# Patient Record
Sex: Female | Born: 1990 | Race: White | Hispanic: No | Marital: Single | Smoking: Former smoker
Health system: Southern US, Community
[De-identification: ages and names within clinical notes are randomized; demographics above are authoritative.]

## PROBLEM LIST (undated history)

## (undated) DIAGNOSIS — R569 Unspecified convulsions: Secondary | ICD-10-CM

## (undated) DIAGNOSIS — G8929 Other chronic pain: Secondary | ICD-10-CM

## (undated) DIAGNOSIS — J45909 Unspecified asthma, uncomplicated: Secondary | ICD-10-CM

## (undated) DIAGNOSIS — F329 Major depressive disorder, single episode, unspecified: Secondary | ICD-10-CM

## (undated) DIAGNOSIS — R51 Headache: Secondary | ICD-10-CM

## (undated) DIAGNOSIS — F32A Depression, unspecified: Secondary | ICD-10-CM

## (undated) DIAGNOSIS — K219 Gastro-esophageal reflux disease without esophagitis: Secondary | ICD-10-CM

## (undated) DIAGNOSIS — R519 Headache, unspecified: Secondary | ICD-10-CM

## (undated) DIAGNOSIS — T7840XA Allergy, unspecified, initial encounter: Secondary | ICD-10-CM

## (undated) DIAGNOSIS — F419 Anxiety disorder, unspecified: Secondary | ICD-10-CM

## (undated) HISTORY — DX: Anxiety disorder, unspecified: F41.9

## (undated) HISTORY — DX: Headache: R51

## (undated) HISTORY — DX: Other chronic pain: G89.29

## (undated) HISTORY — DX: Depression, unspecified: F32.A

## (undated) HISTORY — DX: Major depressive disorder, single episode, unspecified: F32.9

## (undated) HISTORY — DX: Allergy, unspecified, initial encounter: T78.40XA

## (undated) HISTORY — DX: Headache, unspecified: R51.9

---
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
## 2009-02-05 ENCOUNTER — Ambulatory Visit: Payer: Self-pay | Admitting: Family Medicine

## 2009-04-20 ENCOUNTER — Emergency Department (HOSPITAL_COMMUNITY): Admission: EM | Admit: 2009-04-20 | Discharge: 2009-04-20 | Payer: Self-pay | Admitting: Emergency Medicine

## 2009-07-11 ENCOUNTER — Ambulatory Visit: Payer: Self-pay | Admitting: Family Medicine

## 2009-07-11 LAB — CONVERTED CEMR LAB: Rapid Strep: NEGATIVE

## 2009-12-31 ENCOUNTER — Emergency Department (HOSPITAL_COMMUNITY): Admission: EM | Admit: 2009-12-31 | Discharge: 2009-12-31 | Payer: Self-pay | Admitting: Emergency Medicine

## 2010-07-07 ENCOUNTER — Encounter: Admission: RE | Admit: 2010-07-07 | Discharge: 2010-08-16 | Payer: Self-pay | Admitting: Orthopedic Surgery

## 2010-11-28 ENCOUNTER — Emergency Department (HOSPITAL_COMMUNITY)
Admission: EM | Admit: 2010-11-28 | Discharge: 2010-11-28 | Disposition: A | Discharge status: Home / Self Care | Payer: Medicaid Other | Attending: Emergency Medicine | Admitting: Emergency Medicine

## 2010-11-28 DIAGNOSIS — X58XXXA Exposure to other specified factors, initial encounter: Secondary | ICD-10-CM | POA: Insufficient documentation

## 2010-11-28 DIAGNOSIS — G40909 Epilepsy, unspecified, not intractable, without status epilepticus: Secondary | ICD-10-CM | POA: Insufficient documentation

## 2010-11-28 DIAGNOSIS — M549 Dorsalgia, unspecified: Secondary | ICD-10-CM | POA: Insufficient documentation

## 2011-01-10 ENCOUNTER — Ambulatory Visit: Payer: Worker's Compensation | Admitting: Family Medicine

## 2011-02-08 NOTE — Assessment & Plan Note (Signed)
[facility]                                 ON-CALL NOTE   NAME:FRITZGenessa, Beman                         MRN:          [id number]  DATE:11/09/2007                            DOB:          Jun 17, 1991    Phone number is [phone].  It is Clydie Braun at the [employer].  The patient of Dr. Claris Che, question about dosage of medication.  Dosage  was rechecked and is okay.     Jeffrey A. Tawanna Cooler, MD  Electronically Signed    JAT/MedQ  DD: 11/09/2007  DT: 11/12/2007  Job #: [id number]

## 2011-02-11 NOTE — Op Note (Signed)
   NAME:  Brenda Vargas, Brenda Vargas                          ACCOUNT NO.:  [id number]   MEDICAL RECORD NO.:  [id number]                   PATIENT TYPE:  AMB   LOCATION:  DAY                                  FACILITY:  [facility]   PHYSICIAN:  Marlowe Kays, M.D.               DATE OF BIRTH:  1991-06-26   DATE OF PROCEDURE:  DATE OF DISCHARGE:                                 OPERATIVE REPORT   PREOPERATIVE DIAGNOSIS:  Stiffness, left knee, status post open  reduction/internal fixation, displaced tibial tuberosity fracture.   POSTOPERATIVE DIAGNOSIS:  Stiffness, left knee, status post open  reduction/internal fixation, displaced tibial tuberosity fracture.   OPERATION/PROCEDURE:  Closed manipulation, left knee.   SURGEON:  Marlowe Kays, M.D.   ASSISTANT:  Nurse.   ANESTHESIA:  General.   PATHOLOGY AND INDICATIONS:  About 10 weeks ago I had performed open  reduction/internal fixation for displaced tibial tuberosity fracture with  attached ACL.  She was held for six weeks in a cast but on physical therapy  has not wanted to move the knee and therapist had felt that apprehension was  the major cause.  When I saw her in the office yesterday, she would only  move the knee from 0 to 55 degrees and I felt that closed manipulation was  indicated.   DESCRIPTION OF PROCEDURE:  Satisfactory general anesthesia.  Once I took her  left leg and placed my arm beneath her thigh and the knee easily went to  about 75 degrees without doing anything so that we obtained an extra 20  degrees just by anesthetizing.  I then was able to gently manipulate the  knee, bring the knee in flexion and extension and back to about 100 to 105  degrees.  I did not want to force anything and damage the previous repair.  We left that her motion was about from 0 to about 105 degrees of flexion.  At the time of this dictation, she was on her way to the recovery room in  satisfactory condition with no complications.                                        Marlowe Kays, M.D.    JA/MEDQ  D:  06/04/2003  T:  06/04/2003  Job:  [id number]
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
## 2011-02-11 NOTE — Consult Note (Signed)
[place]  Patient:    Brenda Vargas, Brenda Vargas                       MRN: [id number] Proc. Date: 03/02/00 Adm. Date:  [id number] Disc. Date: [id number] Attending:  Benny Lennert CC:         Luz Brazen, M.D., [facility]             Candace Gallus. Ferd Glassing, M.D.                          Consultation Report  DATE OF BIRTH:  1991-01-29  CHIEF COMPLAINT:  Recurrent seizures.  HISTORY OF PRESENT ILLNESS:  Brenda Vargas is a [age]-year-old right-handed girl last seen in my office in 1999.  I believe that the patient has primary generalized epilepsy, juvenile absence type, and had absence and generalized tonic-clonic seizures.  Patient was able to come off of Depakote in 1999 and has had no further seizures until one month ago.  The episode occurred during the day.  Chosen came into the room and had an unusual look upon her face.  She then began to stare unresponsively.  Her father was able to walk her over to the couch and have her lay down.  She remained unresponsive for a period of about seven minutes and then gradually improved.  By the time EMS had arrived at 15 minutes, she was nearly at her baseline.  The decision was made not to transport her to the hospital and I was not called.  Patient was at school today and attended an awards assembly.  She then had a pizza party at her room.  The children were getting ready to go outside and play.  Paislea spilled a drink on herself and then went into a period of unresponsive staring that may have lasted for as long as 20 minutes.  Mashelle remembers that she was not allowed to go outside.  She then does not remember much until she was brought to the hospital.  She arrived at the hospital at around 3:15.  Just prior to that, while she was on the corner of [employer], she had a generalized tonic-clonic seizure that lasted for a few minutes, and was brought to the emergency room to be evaluated.  She was  seen by Dr. Beverely Pace, who asked me to see the patient.  PAST MEDICAL HISTORY:  The patient was born at [facility] and was in the neonatal intensive care unit for two weeks because of elevated white blood cell count and labile sugars.  I do not know if she had sepsis, pneumonia, or what.  The patient has learning disabilities and attention deficit disorder, and is followed by Dr. Virgel Paling.  Patient had onset of her seizures in early elementary school.  As stated above, we were able to taper and discontinue her medication because she had been seizure-free for two years, and has been seizure-free for yet another two years since that time.  Of note is that the patient has had headaches and some malaise after her seizures.  Also, today she complained of pain in her right foot and there is a bruise along the left metatarsal that was x-rayed and shows a growth plate.  Differential diagnosis is a fracture, but Dr. Beverely Pace has looked at it and feels that it is not a fracture.  Fortunately, the patient has not lost control of her  bowels and bladder.  She did not stop breathing.  She had confusion for at least five to ten minutes after the grand mal seizure.  The patient had perioral cyanosis during her generalized tonic-clonic seizure.  REVIEW OF SYSTEMS:  Is unremarkable for intercurrent infections in head and neck, lungs, GI, GU, rashes and easy bruisability, diabetes, or thyroid disease.  Review of systems is otherwise negative.  CURRENT MEDICATIONS:  None.  ALLERGIES:  None.  SOCIAL HISTORY:  The patient has just completed fourth grade and will attend fifth grade next year.  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  FAMILY HISTORY:  Positive for diabetes mellitus and seizures.  PHYSICAL EXAMINATION:  GENERAL:  This is a well-developed, well-nourished, right-handed, Caucasian, tan, brown-haired, brown-eyed girl in no distress.  VITAL SIGNS:  Blood  pressure 122/80, resting pulse 87, respirations 22, temperature 97, pulse oximetry 97%.  HEENT:  No signs of infection.  Tongue is not bitten nor are there any lesions in her mouth.  LUNGS:  Clear to auscultation.  HEART:  No murmurs, pulses normal.  ABDOMEN:  Soft, nontender, bowel sounds normal.  EXTREMITIES:  Well-formed without edema, cyanosis, alterations in tone, or tight heel cords.  She does have a bruise on the outer aspect of her right foot at the proximal metatarsal.  NEUROLOGIC:  Awake, alert.  She did have some problems following two and three-step commands, but by and large, was able to name objects, follow commands, and repeat phrases.  Cranial nerve examination:  Round, reactive pupils, normal fundi.  Visual fields are full to double simultaneous stimuli.  Extraocular movements full and conjugate.  OKN responses equal bilaterally.  Visual acuity not tested. Symmetric facial strength and sensation.  Air conduction greater than bone conduction bilaterally.  Motor examination:  Normal strength, tone, and mass.  Good fine motor movements.  No pronator drift.  Sensation intact to cold, vibration, stereoagnosis.  Cerebellar examination:  Good finger-to-nose, rapid repetitive movements.  No tremor or dystaxia.  ______, gait, and station normal.  Deep tendon reflexes are symmetrically diminished but present everywhere.  She had bilateral flexor plantar responses.  IMPRESSION: 1. Recurrent seizures, both prolonged nonconvulsive and convulsive.  I believe    that these are probably absence with generalized tonic-clonic seizures,    but I  cannot rule out the possibility of complex partial with secondary    generalization, 345.00, 345.10, 345.40. 2. 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 3. I cannot tell whether she is just showing inattentiveness and impulsitivity    with her problems following commands or whether she still is mildly     postictal.  PLAN:  Will send her on home.  Will give her Depakote 250 mg now and again  before she goes to bed.  She will take Depakote 250 mg b.i.d. and we will check drug levels in about 10 days time.  At the same time, we will recheck an EEG, either at my office or at [facility].  Parents will call tomorrow to set up an appointment.  LABORATORY CURRENTLY:  Hemoglobin 12.8, hematocrit 36.2, MCV 87.1.  Platelet 235,000.  White count 3400, 66 polys, absolute granulocyte count 2300, lymphocytes 26, monocytes 4, eosinophils 1, basophils 1, UC 2.  SGPT 17.  DISPOSITION:  Patient is discharged in improved condition.  I have discussed this thoroughly with her parents. DD:  03/02/00 TD:  03/03/00 Job: [id number]
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
## 2011-02-21 ENCOUNTER — Emergency Department (HOSPITAL_COMMUNITY)
Admission: EM | Admit: 2011-02-21 | Discharge: 2011-02-21 | Disposition: A | Discharge status: Home / Self Care | Payer: Medicaid Other | Attending: Emergency Medicine | Admitting: Emergency Medicine

## 2011-02-21 ENCOUNTER — Emergency Department (HOSPITAL_COMMUNITY): Payer: Medicaid Other

## 2011-02-21 DIAGNOSIS — Z79899 Other long term (current) drug therapy: Secondary | ICD-10-CM | POA: Insufficient documentation

## 2011-02-21 DIAGNOSIS — Y92009 Unspecified place in unspecified non-institutional (private) residence as the place of occurrence of the external cause: Secondary | ICD-10-CM | POA: Insufficient documentation

## 2011-02-21 DIAGNOSIS — G40909 Epilepsy, unspecified, not intractable, without status epilepticus: Secondary | ICD-10-CM | POA: Insufficient documentation

## 2011-02-21 DIAGNOSIS — X500XXA Overexertion from strenuous movement or load, initial encounter: Secondary | ICD-10-CM | POA: Insufficient documentation

## 2011-02-21 DIAGNOSIS — S43006A Unspecified dislocation of unspecified shoulder joint, initial encounter: Secondary | ICD-10-CM | POA: Insufficient documentation

## 2011-03-02 ENCOUNTER — Ambulatory Visit
Admission: RE | Admit: 2011-03-02 | Discharge: 2011-03-02 | Disposition: A | Discharge status: Home / Self Care | Payer: Medicaid Other | Source: Ambulatory Visit | Attending: Orthopedic Surgery | Admitting: Orthopedic Surgery

## 2011-03-02 ENCOUNTER — Other Ambulatory Visit: Payer: Self-pay | Admitting: Orthopedic Surgery

## 2011-03-02 DIAGNOSIS — S42293A Other displaced fracture of upper end of unspecified humerus, initial encounter for closed fracture: Secondary | ICD-10-CM

## 2011-03-08 ENCOUNTER — Ambulatory Visit (HOSPITAL_BASED_OUTPATIENT_CLINIC_OR_DEPARTMENT_OTHER)
Admission: RE | Admit: 2011-03-08 | Discharge: 2011-03-08 | Disposition: A | Discharge status: Home / Self Care | Payer: Medicaid Other | Source: Ambulatory Visit | Attending: Orthopedic Surgery | Admitting: Orthopedic Surgery

## 2011-03-08 DIAGNOSIS — M24419 Recurrent dislocation, unspecified shoulder: Secondary | ICD-10-CM | POA: Insufficient documentation

## 2011-03-08 DIAGNOSIS — M75110 Incomplete rotator cuff tear or rupture of unspecified shoulder, not specified as traumatic: Secondary | ICD-10-CM | POA: Insufficient documentation

## 2011-03-08 DIAGNOSIS — E669 Obesity, unspecified: Secondary | ICD-10-CM | POA: Insufficient documentation

## 2011-03-08 DIAGNOSIS — Z01812 Encounter for preprocedural laboratory examination: Secondary | ICD-10-CM | POA: Insufficient documentation

## 2011-03-08 LAB — POCT HEMOGLOBIN-HEMACUE: Hemoglobin: 14.2 g/dL (ref 12.0–15.0)

## 2011-04-19 NOTE — Op Note (Signed)
NAMEGESENIA, BANTZ NO.:  [id number]  MEDICAL RECORD NO.:  [id number]  LOCATION:                                 FACILITY:  PHYSICIAN:  Jones Broom, MD    DATE OF BIRTH:  [birth year]  DATE OF PROCEDURE:  03/08/2011 DATE OF DISCHARGE:                              OPERATIVE REPORT   PREOPERATIVE DIAGNOSIS:  Right shoulder recurrent instability.  POSTOPERATIVE DIAGNOSES: 1. Right shoulder Bankart anterior labral tear. 2. Right shoulder partial-thickness supraspinatus rotator cuff tear.  PROCEDURE PERFORMED: 1. Right shoulder arthroscopic Bankart repair. 2. Right shoulder arthroscopic rotator cuff repair.  ATTENDING SURGEON:  Jones Broom, MD  ASSISTANT:  Eulas Post, MD  COMPLICATIONS:  None.  DRAINS:  None.  SPECIMENS:  None.  ESTIMATED BLOOD LOSS:  Minimal.  INDICATIONS FOR SURGERY:  The patient is a 20 year old female who has had multiple prior right shoulder dislocations.  The most recent dislocations have been with minimal trauma.  A CT scan demonstrated a large Hill-Sachs defect, which was more superior than posterior and involved the footprint of the supraspinatus insertion.  She was suspected to have a Bankart tear as well as a partial thickness supraspinatus tear.  She was indicated for arthroscopic Bankart repair and possible rotator cuff repair as well.  She understood risks, benefits and alternatives to procedure including, but not limited to risk of bleeding, infection, damage to neurovascular structures, risk of stiffness, nonhealing and potential need for future surgery.  OPERATIVE FINDINGS:  Examination under anesthesia demonstrated grade 3 anterior instability.  No significant posterior or inferior instability. Diagnostic arthroscopy revealed a large Hill-Sachs lesion which extended around superiorly into the footprint of the supraspinatus and posteriorly down the posterior humeral head.  At 90 degrees of  abduction and external rotation, it did not engage, but was felt to be so large in size involving the footprint of the rotator cuff that I did feel a partial articular-sided rotator cuff repair/ remplissage was indicated. She did have a complete Bankart tear which was retracted medially.  This was mobilized and repaired with three 2.4-mm BioComposite suture tacks. The rotator cuff repair was carried out with one 5.5-mm BioComposite Corkscrew anchor into the defect.  PROCEDURE:  The patient was identified in preoperative holding area where I personally marked the operative site after verifying the site, side, and procedure with the patient.  She was taken back to the operating room after interscalene block was given by the attending anesthesiologist.  In general, anesthesia was then induced without complication.  She did have appropriate time-out procedure.  She was placed in a beach-chair position.  The right upper extremity was then prepped and draped.  The opposite extremities were carefully padded and positioned.  The patient did receive IV antibiotics prior to incision. The standard posterior portal was established and the arthroscope was introduced in the joint.  Anterior portal was then established with needle localization above the subscapularis.  Diagnostic arthroscopy was then carried out with findings as described above.  There was no glenoid bone loss.  She did have complete absence of the anterior labrum which was torn and medialized.  No loose bodies were noted in  the joint.  The articular cartilage looks good with the exception of the superior aspect of the humeral head, which had a large impaction injury which extended around posteriorly.  This involved a large portion of the supraspinatus footprint.  A lateral portal was established in the rotator interval just behind the biceps tendon and the camera was moved to this position. A small cannula was placed posteriorly with  a large cannula anteriorly. An elevator was used to completely free up the anterior labrum and a grasper was used to verify that was able to be mobilized to the articular surface.  The labrum was then repaired by sequentially placing 2.4-mm suture tack anchors from inferiorly at the 5 o'clock position up to just above the 3 o'clock position with 3 anchors.  Each was placed by drilling, impacting the anchor and then passing the sutures in a retrograde fashion using the 45-degree suture lasso to the right. Excellent reapproximation of tissue was noted anteriorly.  Attention was then turned to the Hill-Sachs defect and greater tuberosity defect.  It was felt that remplissage/partial articular rotator cuff repair was indicated in this case.  Therefore, using the camera in the anterior portal, I debrided the defect through the posterior portal down to the bleeding surface.  I then placed a 5.5-mm BioComposite Corkscrew anchor percutaneously through the cuff into the defect.  I then used spinal needles percutaneously to pass all 4 strands from the anchor in horizontal mattress fashion spread out over the defect.  The camera was then moved to the subacromial space where bursectomy was performed to find the sutures and then each suture was sequentially tied bringing the tendon nicely down to the prepared defect.  The arthroscope was introduced in the anterior cannula again and the defect was visualized. The tendon was repaired nicely all the way down to the defect.  The arthroscope was then removed from the joint and portals were closed using a 3-0 nylon in interrupted fashion.  Sterile dressings were then applied including Xeroform, 4x4s, ABDs and tape.  The patient was then allowed to awake from general anesthesia, transferred to the stretcher and taken to the recovery room in stable condition.  POSTOPERATIVE PLAN:  She will be discharged home today with her family in a sling immobilizer.  She  will follow up in 1 week for suture removal and wound check.  We will begin some gentle passive motion at that time. She will be discharged with Percocet for pain control.     Jones Broom, MD     JC/MEDQ  D:  03/08/2011  T:  03/09/2011  Job:  [id number]  Electronically Signed by Jones Broom  on 04/19/2011 03:55:20 PM

## 2011-04-21 ENCOUNTER — Ambulatory Visit: Payer: Self-pay | Attending: Orthopedic Surgery | Admitting: Rehabilitation

## 2011-04-21 DIAGNOSIS — M6281 Muscle weakness (generalized): Secondary | ICD-10-CM | POA: Insufficient documentation

## 2011-04-21 DIAGNOSIS — IMO0001 Reserved for inherently not codable concepts without codable children: Secondary | ICD-10-CM | POA: Insufficient documentation

## 2011-04-21 DIAGNOSIS — M25519 Pain in unspecified shoulder: Secondary | ICD-10-CM | POA: Insufficient documentation

## 2011-04-21 DIAGNOSIS — M25619 Stiffness of unspecified shoulder, not elsewhere classified: Secondary | ICD-10-CM | POA: Insufficient documentation

## 2011-04-26 ENCOUNTER — Ambulatory Visit: Payer: Self-pay | Admitting: Rehabilitation

## 2011-04-28 ENCOUNTER — Ambulatory Visit: Payer: Self-pay | Attending: Orthopedic Surgery | Admitting: Rehabilitation

## 2011-04-28 DIAGNOSIS — IMO0001 Reserved for inherently not codable concepts without codable children: Secondary | ICD-10-CM | POA: Insufficient documentation

## 2011-04-28 DIAGNOSIS — M25519 Pain in unspecified shoulder: Secondary | ICD-10-CM | POA: Insufficient documentation

## 2011-04-28 DIAGNOSIS — M6281 Muscle weakness (generalized): Secondary | ICD-10-CM | POA: Insufficient documentation

## 2011-04-28 DIAGNOSIS — M25619 Stiffness of unspecified shoulder, not elsewhere classified: Secondary | ICD-10-CM | POA: Insufficient documentation

## 2011-05-03 ENCOUNTER — Ambulatory Visit: Payer: Self-pay | Admitting: Physical Therapy

## 2011-05-05 ENCOUNTER — Ambulatory Visit: Payer: Self-pay | Admitting: Physical Therapy

## 2011-05-10 ENCOUNTER — Ambulatory Visit: Payer: Self-pay | Admitting: Physical Therapy

## 2011-05-11 ENCOUNTER — Ambulatory Visit: Payer: Self-pay | Admitting: Physical Therapy

## 2011-05-17 ENCOUNTER — Ambulatory Visit: Payer: Self-pay | Admitting: Physical Therapy

## 2011-05-19 ENCOUNTER — Ambulatory Visit: Payer: Self-pay | Admitting: Physical Therapy

## 2011-05-24 ENCOUNTER — Ambulatory Visit: Payer: Self-pay | Admitting: Physical Therapy

## 2011-05-26 ENCOUNTER — Ambulatory Visit: Payer: Self-pay | Admitting: Physical Therapy

## 2011-05-27 ENCOUNTER — Other Ambulatory Visit: Payer: Self-pay | Admitting: *Deleted

## 2011-05-27 ENCOUNTER — Other Ambulatory Visit (HOSPITAL_COMMUNITY)
Admission: RE | Admit: 2011-05-27 | Discharge: 2011-05-27 | Disposition: A | Discharge status: Home / Self Care | Payer: Medicaid Other | Source: Ambulatory Visit | Attending: Geriatric Medicine | Admitting: Geriatric Medicine

## 2011-05-27 DIAGNOSIS — Z01419 Encounter for gynecological examination (general) (routine) without abnormal findings: Secondary | ICD-10-CM | POA: Insufficient documentation

## 2011-05-31 ENCOUNTER — Ambulatory Visit: Payer: Self-pay | Admitting: Physical Therapy

## 2011-06-02 ENCOUNTER — Ambulatory Visit: Payer: Self-pay | Attending: Orthopedic Surgery | Admitting: Physical Therapy

## 2011-06-02 DIAGNOSIS — IMO0001 Reserved for inherently not codable concepts without codable children: Secondary | ICD-10-CM | POA: Insufficient documentation

## 2011-06-02 DIAGNOSIS — M25519 Pain in unspecified shoulder: Secondary | ICD-10-CM | POA: Insufficient documentation

## 2011-06-02 DIAGNOSIS — M6281 Muscle weakness (generalized): Secondary | ICD-10-CM | POA: Insufficient documentation

## 2011-06-02 DIAGNOSIS — M25619 Stiffness of unspecified shoulder, not elsewhere classified: Secondary | ICD-10-CM | POA: Insufficient documentation

## 2011-06-07 ENCOUNTER — Ambulatory Visit: Payer: Self-pay | Admitting: Physical Therapy

## 2011-06-09 ENCOUNTER — Ambulatory Visit: Payer: Self-pay | Admitting: Physical Therapy

## 2011-06-14 ENCOUNTER — Ambulatory Visit: Payer: Self-pay | Admitting: Physical Therapy

## 2011-06-16 ENCOUNTER — Ambulatory Visit: Payer: Self-pay | Admitting: Physical Therapy

## 2011-06-21 ENCOUNTER — Ambulatory Visit: Payer: Self-pay | Admitting: Physical Therapy

## 2011-06-23 ENCOUNTER — Ambulatory Visit: Payer: Self-pay | Admitting: Physical Therapy

## 2011-06-28 ENCOUNTER — Ambulatory Visit: Payer: Self-pay | Attending: Orthopedic Surgery | Admitting: Physical Therapy

## 2011-06-28 DIAGNOSIS — M6281 Muscle weakness (generalized): Secondary | ICD-10-CM | POA: Insufficient documentation

## 2011-06-28 DIAGNOSIS — M25519 Pain in unspecified shoulder: Secondary | ICD-10-CM | POA: Insufficient documentation

## 2011-06-28 DIAGNOSIS — IMO0001 Reserved for inherently not codable concepts without codable children: Secondary | ICD-10-CM | POA: Insufficient documentation

## 2011-06-28 DIAGNOSIS — M25619 Stiffness of unspecified shoulder, not elsewhere classified: Secondary | ICD-10-CM | POA: Insufficient documentation

## 2011-06-30 ENCOUNTER — Ambulatory Visit: Payer: Self-pay | Admitting: Physical Therapy

## 2011-07-05 ENCOUNTER — Ambulatory Visit: Payer: Self-pay | Admitting: Physical Therapy

## 2011-07-08 LAB — POCT PREGNANCY, URINE: Preg Test, Ur: NEGATIVE

## 2011-07-12 ENCOUNTER — Ambulatory Visit: Payer: Self-pay | Admitting: Physical Therapy

## 2011-07-14 ENCOUNTER — Ambulatory Visit: Payer: Self-pay | Admitting: Physical Therapy

## 2011-07-19 ENCOUNTER — Ambulatory Visit: Payer: Self-pay | Admitting: Physical Therapy

## 2011-07-21 ENCOUNTER — Ambulatory Visit: Payer: Self-pay | Admitting: Physical Therapy

## 2011-07-26 ENCOUNTER — Ambulatory Visit: Payer: Self-pay | Admitting: Physical Therapy

## 2012-05-25 ENCOUNTER — Other Ambulatory Visit: Payer: Self-pay | Admitting: Physician Assistant

## 2012-05-25 DIAGNOSIS — R1011 Right upper quadrant pain: Secondary | ICD-10-CM

## 2012-05-25 DIAGNOSIS — R110 Nausea: Secondary | ICD-10-CM

## 2012-05-30 ENCOUNTER — Ambulatory Visit
Admission: RE | Admit: 2012-05-30 | Discharge: 2012-05-30 | Disposition: A | Discharge status: Home / Self Care | Payer: Medicaid Other | Source: Ambulatory Visit | Attending: Physician Assistant | Admitting: Physician Assistant

## 2012-05-30 ENCOUNTER — Other Ambulatory Visit: Payer: Self-pay | Admitting: Physician Assistant

## 2012-05-30 DIAGNOSIS — R110 Nausea: Secondary | ICD-10-CM

## 2012-05-30 DIAGNOSIS — R1011 Right upper quadrant pain: Secondary | ICD-10-CM

## 2012-10-31 ENCOUNTER — Encounter (HOSPITAL_COMMUNITY): Payer: Self-pay | Admitting: Emergency Medicine

## 2012-10-31 ENCOUNTER — Emergency Department (HOSPITAL_COMMUNITY)
Admission: EM | Admit: 2012-10-31 | Discharge: 2012-10-31 | Disposition: A | Discharge status: Home / Self Care | Payer: Medicaid Other | Source: Home / Self Care | Attending: Emergency Medicine | Admitting: Emergency Medicine

## 2012-10-31 ENCOUNTER — Emergency Department (INDEPENDENT_AMBULATORY_CARE_PROVIDER_SITE_OTHER): Payer: Medicaid Other

## 2012-10-31 DIAGNOSIS — J209 Acute bronchitis, unspecified: Secondary | ICD-10-CM

## 2012-10-31 HISTORY — DX: Unspecified convulsions: R56.9

## 2012-10-31 LAB — POCT RAPID STREP A: Streptococcus, Group A Screen (Direct): NEGATIVE

## 2012-10-31 MED ORDER — IBUPROFEN 800 MG PO TABS
ORAL_TABLET | ORAL | Status: AC
  Filled 2012-10-31: qty 1

## 2012-10-31 MED ORDER — AZITHROMYCIN 250 MG PO TABS: ORAL_TABLET | ORAL | Status: DC

## 2012-10-31 MED ORDER — ALBUTEROL SULFATE HFA 108 (90 BASE) MCG/ACT IN AERS: 1.0000 | INHALATION_SPRAY | Freq: Four times a day (QID) | RESPIRATORY_TRACT | Status: DC | PRN

## 2012-10-31 MED ORDER — FEXOFENADINE HCL 180 MG PO TABS: 180.0000 mg | ORAL_TABLET | Freq: Every day | ORAL | Status: DC

## 2012-10-31 MED ORDER — TRAMADOL HCL 50 MG PO TABS: 100.0000 mg | ORAL_TABLET | Freq: Three times a day (TID) | ORAL | Status: DC | PRN

## 2012-10-31 MED ORDER — IBUPROFEN 800 MG PO TABS
800.0000 mg | ORAL_TABLET | Freq: Once | ORAL | Status: AC
  Administered 2012-10-31: 800 mg via ORAL

## 2012-10-31 NOTE — ED Notes (Signed)
Cough for 1 week , worse past 2 days; NAD

## 2012-10-31 NOTE — ED Provider Notes (Signed)
Chief Complaint  Patient presents with  . Cough    History of Present Illness:   Brenda Vargas  is a 22 year old female who's had a one-week history of cough productive of white sputum, wheezing, dizziness, fatigue, aching in the anterior chest over the sternum, sore throat, nasal congestion, rhinorrhea with white drainage, headache, left ear congestion, and chills. She denies any GI symptoms. She has not been exposed to anything in particular. She has not tried any medication for symptomatic relief at home. She has an allergy to codeine. She takes Topamax for seizures.  Review of Systems:  Other than noted above, the patient denies any of the following symptoms. Systemic:  No fever, chills, sweats, fatigue, myalgias, headache, or anorexia. Eye:  No redness, pain or drainage. ENT:  No earache, ear congestion, nasal congestion, sneezing, rhinorrhea, sinus pressure, sinus pain, post nasal drip, or sore throat. Lungs:  No cough, sputum production, wheezing, shortness of breath, or chest pain. GI:  No abdominal pain, nausea, vomiting, or diarrhea.  PMFSH:  Past medical history, family history, social history, meds, and allergies were reviewed.  Physical Exam:   Vital signs:  BP 111/67  Pulse 85  Temp 98 F (36.7 C) (Oral)  Resp 16  Ht 5\' 2"  (1.575 m)  SpO2 100%  LMP 10/20/2012 General:  Alert, in no distress. Eye:  No conjunctival injection or drainage. Lids were normal. ENT:  TMs and canals were normal, without erythema or inflammation.  Nasal mucosa was clear and uncongested, without drainage.  Mucous membranes were moist.  Pharynx was clear, without exudate or drainage.  There were no oral ulcerations or lesions. Neck:  Supple, no adenopathy, tenderness or mass. Lungs:  No respiratory distress.  Lungs were clear to auscultation, without wheezes, rales or rhonchi.  Breath sounds were clear and equal bilaterally.  Heart:  Regular rhythm, without gallops, murmers or rubs. Skin:  Clear,  warm, and dry, without rash or lesions.  Labs:   Results for orders placed during the hospital encounter of 10/31/12  POCT RAPID STREP A ([facility] CARE ONLY)      Component Value Range   Streptococcus, Group A Screen (Direct) NEGATIVE  NEGATIVE    Radiology:  Dg Chest 2 View  10/31/2012  *RADIOLOGY REPORT*  Clinical Data: Cough and chest pain  CHEST - 2 VIEW  Comparison: None.  Findings: Normal heart size.  Clear lungs.  No pneumothorax and no pleural effusion.  IMPRESSION: No active cardiopulmonary disease.   Original Report Authenticated By: Jolaine Click, M.D.    I reviewed the images independently and personally and concur with the radiologist's findings.  Assessment:  The encounter diagnosis was Acute bronchitis.  Plan:   1.  The following meds were prescribed:   New Prescriptions   ALBUTEROL (PROVENTIL HFA;VENTOLIN HFA) 108 (90 BASE) MCG/ACT INHALER    Inhale 1-2 puffs into the lungs every 6 (six) hours as needed for wheezing.   AZITHROMYCIN (ZITHROMAX Z-PAK) 250 MG TABLET    Take as directed.   FEXOFENADINE (ALLEGRA) 180 MG TABLET    Take 1 tablet (180 mg total) by mouth daily.   TRAMADOL (ULTRAM) 50 MG TABLET    Take 2 tablets (100 mg total) by mouth every 8 (eight) hours as needed for pain.   2.  The patient was instructed in symptomatic care and handouts were given. 3.  The patient was told to return if becoming worse in any way, if no better in 3 or 4 days, and  given some red flag symptoms that would indicate earlier return.   Reuben Likes, MD 10/31/12 [phone]

## 2013-11-16 ENCOUNTER — Encounter (HOSPITAL_COMMUNITY): Payer: Self-pay | Admitting: Emergency Medicine

## 2013-11-16 ENCOUNTER — Emergency Department (HOSPITAL_COMMUNITY)
Admission: EM | Admit: 2013-11-16 | Discharge: 2013-11-16 | Disposition: A | Discharge status: Home / Self Care | Payer: Medicaid Other | Source: Home / Self Care

## 2013-11-16 DIAGNOSIS — J45909 Unspecified asthma, uncomplicated: Secondary | ICD-10-CM

## 2013-11-16 DIAGNOSIS — J069 Acute upper respiratory infection, unspecified: Secondary | ICD-10-CM

## 2013-11-16 MED ORDER — ALBUTEROL SULFATE HFA 108 (90 BASE) MCG/ACT IN AERS: 2.0000 | INHALATION_SPRAY | RESPIRATORY_TRACT | Status: DC | PRN

## 2013-11-16 MED ORDER — IPRATROPIUM-ALBUTEROL 0.5-2.5 (3) MG/3ML IN SOLN
3.0000 mL | Freq: Once | RESPIRATORY_TRACT | Status: AC
  Administered 2013-11-16: 3 mL via RESPIRATORY_TRACT

## 2013-11-16 MED ORDER — IPRATROPIUM-ALBUTEROL 0.5-2.5 (3) MG/3ML IN SOLN
RESPIRATORY_TRACT | Status: AC
  Filled 2013-11-16: qty 3

## 2013-11-16 MED ORDER — FEXOFENADINE HCL 180 MG PO TABS: 180.0000 mg | ORAL_TABLET | Freq: Every day | ORAL | Status: DC

## 2013-11-16 MED ORDER — METHYLPREDNISOLONE 4 MG PO KIT: PACK | ORAL | Status: DC

## 2013-11-16 NOTE — Discharge Instructions (Signed)
Bronchospasm, Adult A bronchospasm is a spasm or tightening of the airways going into the lungs. During a bronchospasm breathing becomes more difficult because the airways get smaller. When this happens there can be coughing, a whistling sound when breathing (wheezing), and difficulty breathing. Bronchospasm is often associated with asthma, but not all patients who experience a bronchospasm have asthma. CAUSES  A bronchospasm is caused by inflammation or irritation of the airways. The inflammation or irritation may be triggered by:   Allergies (such as to animals, pollen, food, or mold). Allergens that cause bronchospasm may cause wheezing immediately after exposure or many hours later.   Infection. Viral infections are believed to be the most common cause of bronchospasm.   Exercise.   Irritants (such as pollution, cigarette smoke, strong odors, aerosol sprays, and paint fumes).   Weather changes. Winds increase molds and pollens in the air. Rain refreshes the air by washing irritants out. Cold air may cause inflammation.   Stress and emotional upset.  SIGNS AND SYMPTOMS   Wheezing.   Excessive nighttime coughing.   Frequent or severe coughing with a simple cold.   Chest tightness.   Shortness of breath.  DIAGNOSIS  Bronchospasm is usually diagnosed through a history and physical exam. Tests, such as chest X-rays, are sometimes done to look for other conditions. TREATMENT   Inhaled medicines can be given to open up your airways and help you breathe. The medicines can be given using either an inhaler or a nebulizer machine.  Corticosteroid medicines may be given for severe bronchospasm, usually when it is associated with asthma. HOME CARE INSTRUCTIONS   Always have a plan prepared for seeking medical care. Know when to call your health care provider and local emergency services (911 in the [place] Know where you can access local emergency care.  Only take medicines as  directed by your health care provider.  If you were prescribed an inhaler or nebulizer machine, ask your health care provider to explain how to use it correctly. Always use a spacer with your inhaler if you were given one.  It is necessary to remain calm during an attack. Try to relax and breathe more slowly.  Control your home environment in the following ways:   Change your heating and air conditioning filter at least once a month.   Limit your use of fireplaces and wood stoves.  Do not smoke and do not allow smoking in your home.   Avoid exposure to perfumes and fragrances.   Get rid of pests (such as roaches and mice) and their droppings.   Throw away plants if you see mold on them.   Keep your house clean and dust free.   Replace carpet with wood, tile, or vinyl flooring. Carpet can trap dander and dust.   Use allergy-proof pillows, mattress covers, and box spring covers.   Wash bed sheets and blankets every week in hot water and dry them in a dryer.   Use blankets that are made of polyester or cotton.   Wash hands frequently. SEEK MEDICAL CARE IF:   You have muscle aches.   You have chest pain.   The sputum changes from clear or white to yellow, green, gray, or bloody.   The sputum you cough up gets thicker.   There are problems that may be related to the medicine you are given, such as a rash, itching, swelling, or trouble breathing.  SEEK IMMEDIATE MEDICAL CARE IF:   You have worsening wheezing and coughing  even after taking your prescribed medicines.   You have increased difficulty breathing.   You develop severe chest pain. MAKE SURE YOU:   Understand these instructions.  Will watch your condition.  Will get help right away if you are not doing well or get worse. Document Released: 09/15/2003 Document Revised: 05/15/2013 Document Reviewed: 03/04/2013 [facility] Patient Information 2014 [place], [place].  Cough, Adult  A cough is a  reflex that helps clear your throat and airways. It can help heal the body or may be a reaction to an irritated airway. A cough may only last 2 or 3 weeks (acute) or may last more than 8 weeks (chronic).  CAUSES Acute cough:  Viral or bacterial infections. Chronic cough:  Infections.  Allergies.  Asthma.  Post-nasal drip.  Smoking.  Heartburn or acid reflux.  Some medicines.  Chronic lung problems (COPD).  Cancer. SYMPTOMS   Cough.  Fever.  Chest pain.  Increased breathing rate.  High-pitched whistling sound when breathing (wheezing).  Colored mucus that you cough up (sputum). TREATMENT   A bacterial cough may be treated with antibiotic medicine.  A viral cough must run its course and will not respond to antibiotics.  Your caregiver may recommend other treatments if you have a chronic cough. HOME CARE INSTRUCTIONS   Only take over-the-counter or prescription medicines for pain, discomfort, or fever as directed by your caregiver. Use cough suppressants only as directed by your caregiver.  Use a cold steam vaporizer or humidifier in your bedroom or home to help loosen secretions.  Sleep in a semi-upright position if your cough is worse at night.  Rest as needed.  Stop smoking if you smoke. SEEK IMMEDIATE MEDICAL CARE IF:   You have pus in your sputum.  Your cough starts to worsen.  You cannot control your cough with suppressants and are losing sleep.  You begin coughing up blood.  You have difficulty breathing.  You develop pain which is getting worse or is uncontrolled with medicine.  You have a fever. MAKE SURE YOU:   Understand these instructions.  Will watch your condition.  Will get help right away if you are not doing well or get worse. Document Released: 03/11/2011 Document Revised: 12/05/2011 Document Reviewed: 03/11/2011 [facility] Patient Information 2014 [place], [place].  How to Use an Inhaler Using your inhaler correctly is very  important. Good technique will make sure that the medicine reaches your lungs.  HOW TO USE AN INHALER: 1. Take the cap off the inhaler. 2. If this is the first time using your inhaler, you need to prime it. Shake the inhaler for 5 seconds. Release four puffs into the air, away from your face. Ask your doctor for help if you have questions. 3. Shake the inhaler for 5 seconds. 4. Turn the inhaler so the bottle is above the mouthpiece. 5. Put your pointer finger on top of the bottle. Your thumb holds the bottom of the inhaler. 6. Open your mouth. 7. Either hold the inhaler away from your mouth (the width of 2 fingers) or place your lips tightly around the mouthpiece. Ask your doctor which way to use your inhaler. 8. Breathe out as much air as possible. 9. Breathe in and push down on the bottle 1 time to release the medicine. You will feel the medicine go in your mouth and throat. 10. Continue to take a deep breath in very slowly. Try to fill your lungs. 11. After you have breathed in completely, hold your breath for 10  seconds. This will help the medicine to settle in your lungs. If you cannot hold your breath for 10 seconds, hold it for as long as you can before you breathe out. 12. Breathe out slowly, through pursed lips. Whistling is an example of pursed lips. 13. If your doctor has told you to take more than 1 puff, wait at least 15 30 seconds between puffs. This will help you get the best results from your medicine. Do not use the inhaler more than your doctor tells you to. 14. Put the cap back on the inhaler. 15. Follow the directions from your doctor or from the inhaler package about cleaning the inhaler. If you use more than one inhaler, ask your doctor which inhalers to use and what order to use them in. Ask your doctor to help you figure out when you will need to refill your inhaler.  If you use a steroid inhaler, always rinse your mouth with water after your last puff, gargle and spit out  the water. Do not swallow the water. GET HELP IF:  The inhaler medicine only partially helps to stop wheezing or shortness of breath.  You are having trouble using your inhaler.  You have some increase in thick spit (phlegm). GET HELP RIGHT AWAY IF:  The inhaler medicine does not help your wheezing or shortness of breath or you have tightness in your chest.  You have dizziness, headaches, or fast heart rate.  You have chills, fever, or night sweats.  You have a large increase of thick spit, or your thick spit is bloody. MAKE SURE YOU:   Understand these instructions.  Will watch your condition.  Will get help right away if you are not doing well or get worse. Document Released: 06/21/2008 Document Revised: 07/03/2013 Document Reviewed: 04/11/2013 [facility] Patient Information 2014 [place], [place].  Upper Respiratory Infection, Adult An upper respiratory infection (URI) is also sometimes known as the common cold. The upper respiratory tract includes the nose, sinuses, throat, trachea, and bronchi. Bronchi are the airways leading to the lungs. Most people improve within 1 week, but symptoms can last up to 2 weeks. A residual cough may last even longer.  CAUSES Many different viruses can infect the tissues lining the upper respiratory tract. The tissues become irritated and inflamed and often become very moist. Mucus production is also common. A cold is contagious. You can easily spread the virus to others by oral contact. This includes kissing, sharing a glass, coughing, or sneezing. Touching your mouth or nose and then touching a surface, which is then touched by another person, can also spread the virus. SYMPTOMS  Symptoms typically develop 1 to 3 days after you come in contact with a cold virus. Symptoms vary from person to person. They may include:  Runny nose.  Sneezing.  Nasal congestion.  Sinus irritation.  Sore throat.  Loss of voice  (laryngitis).  Cough.  Fatigue.  Muscle aches.  Loss of appetite.  Headache.  Low-grade fever. DIAGNOSIS  You might diagnose your own cold based on familiar symptoms, since most people get a cold 2 to 3 times a year. Your caregiver can confirm this based on your exam. Most importantly, your caregiver can check that your symptoms are not due to another disease such as strep throat, sinusitis, pneumonia, asthma, or epiglottitis. Blood tests, throat tests, and X-rays are not necessary to diagnose a common cold, but they may sometimes be helpful in excluding other more serious diseases. Your caregiver will decide if  any further tests are required. RISKS AND COMPLICATIONS  You may be at risk for a more severe case of the common cold if you smoke cigarettes, have chronic heart disease (such as heart failure) or lung disease (such as asthma), or if you have a weakened immune system. The very young and very old are also at risk for more serious infections. Bacterial sinusitis, middle ear infections, and bacterial pneumonia can complicate the common cold. The common cold can worsen asthma and chronic obstructive pulmonary disease (COPD). Sometimes, these complications can require emergency medical care and may be life-threatening. PREVENTION  The best way to protect against getting a cold is to practice good hygiene. Avoid oral or hand contact with people with cold symptoms. Wash your hands often if contact occurs. There is no clear evidence that vitamin C, vitamin E, echinacea, or exercise reduces the chance of developing a cold. However, it is always recommended to get plenty of rest and practice good nutrition. TREATMENT  Treatment is directed at relieving symptoms. There is no cure. Antibiotics are not effective, because the infection is caused by a virus, not by bacteria. Treatment may include:  Increased fluid intake. Sports drinks offer valuable electrolytes, sugars, and fluids.  Breathing  heated mist or steam (vaporizer or shower).  Eating chicken soup or other clear broths, and maintaining good nutrition.  Getting plenty of rest.  Using gargles or lozenges for comfort.  Controlling fevers with ibuprofen or acetaminophen as directed by your caregiver.  Increasing usage of your inhaler if you have asthma. Zinc gel and zinc lozenges, taken in the first 24 hours of the common cold, can shorten the duration and lessen the severity of symptoms. Pain medicines may help with fever, muscle aches, and throat pain. A variety of non-prescription medicines are available to treat congestion and runny nose. Your caregiver can make recommendations and may suggest nasal or lung inhalers for other symptoms.  HOME CARE INSTRUCTIONS   Only take over-the-counter or prescription medicines for pain, discomfort, or fever as directed by your caregiver.  Use a warm mist humidifier or inhale steam from a shower to increase air moisture. This may keep secretions moist and make it easier to breathe.  Drink enough water and fluids to keep your urine clear or pale yellow.  Rest as needed.  Return to work when your temperature has returned to normal or as your caregiver advises. You may need to stay home longer to avoid infecting others. You can also use a face mask and careful hand washing to prevent spread of the virus. SEEK MEDICAL CARE IF:   After the first few days, you feel you are getting worse rather than better.  You need your caregiver's advice about medicines to control symptoms.  You develop chills, worsening shortness of breath, or brown or red sputum. These may be signs of pneumonia.  You develop yellow or brown nasal discharge or pain in the face, especially when you bend forward. These may be signs of sinusitis.  You develop a fever, swollen neck glands, pain with swallowing, or white areas in the back of your throat. These may be signs of strep throat. SEEK IMMEDIATE MEDICAL CARE  IF:   You have a fever.  You develop severe or persistent headache, ear pain, sinus pain, or chest pain.  You develop wheezing, a prolonged cough, cough up blood, or have a change in your usual mucus (if you have chronic lung disease).  You develop sore muscles or a stiff neck.  Document Released: 03/08/2001 Document Revised: 12/05/2011 Document Reviewed: 01/14/2011 [facility] Patient Information 2014 [place], [place].

## 2013-11-16 NOTE — ED Provider Notes (Signed)
CSN: [id number]     Arrival date & time 11/16/13  1058 History   First MD Initiated Contact with Patient 11/16/13 1143     Chief Complaint  Patient presents with  . Chest Pain  . Emesis     (Consider location/radiation/quality/duration/timing/severity/associated sxs/prior Treatment) HPI Comments: Severely obese 23 year old female with an apparent low fund of knowledge and learning disability presents with chest pain for the past 3 days. She states the pain is worse with taking a deep breath. It is located in the parasternal orders and across the upper chest. She is also complaining of wheezing and cough that is primarily at night. She has nasal congestion and sniffles. She vomited once very early this morning. Denies fever.   Past Medical History  Diagnosis Date  . Seizure    History reviewed. No pertinent past surgical history. History reviewed. No pertinent family history. History  Substance Use Topics  . Smoking status: Never Smoker   . Smokeless tobacco: Not on file  . Alcohol Use: No   OB History   Grav Para Term Preterm Abortions TAB SAB Ect Mult Living                 Review of Systems  Constitutional: Positive for activity change. Negative for fever.  HENT: Positive for congestion, rhinorrhea and sore throat. Negative for postnasal drip and trouble swallowing.   Respiratory: Positive for cough, shortness of breath and wheezing. Negative for chest tightness.   Cardiovascular: Positive for chest pain.  Gastrointestinal: Negative.   Genitourinary: Negative.   Neurological: Negative.   Psychiatric/Behavioral: Negative.       Allergies  Codeine  Home Medications   Current Outpatient Rx  Name  Route  Sig  Dispense  Refill  . albuterol (PROVENTIL HFA;VENTOLIN HFA) 108 (90 BASE) MCG/ACT inhaler   Inhalation   Inhale 1-2 puffs into the lungs every 6 (six) hours as needed for wheezing.   1 Inhaler   0   . albuterol (PROVENTIL HFA;VENTOLIN HFA) 108 (90 BASE)  MCG/ACT inhaler   Inhalation   Inhale 2 puffs into the lungs every 4 (four) hours as needed for wheezing or shortness of breath.   1 Inhaler   0   . azithromycin (ZITHROMAX Z-PAK) 250 MG tablet      Take as directed.   6 tablet   0   . fexofenadine (ALLEGRA) 180 MG tablet   Oral   Take 1 tablet (180 mg total) by mouth daily.   15 tablet   0   . fexofenadine (ALLEGRA) 180 MG tablet   Oral   Take 1 tablet (180 mg total) by mouth daily.   14 tablet   0   . methylPREDNISolone (MEDROL DOSEPAK) 4 MG tablet      follow package directions   21 tablet   0   . topiramate (TOPAMAX) 100 MG tablet   Oral   Take 100 mg by mouth 2 (two) times daily.         . traMADol (ULTRAM) 50 MG tablet   Oral   Take 2 tablets (100 mg total) by mouth every 8 (eight) hours as needed for pain.   30 tablet   0    BP 104/73  Pulse 73  Temp(Src) 97.1 F (36.2 C) (Oral)  Resp 18  SpO2 97%  LMP 11/02/2013 Physical Exam  Nursing note and vitals reviewed. Constitutional: She is oriented to person, place, and time. She appears well-developed and well-nourished. No distress.  HENT:  Mouth/Throat: Oropharynx is clear and moist. No oropharyngeal exudate.  Bilateral TMs are normal  Eyes: Conjunctivae and EOM are normal.  Neck: Normal range of motion. Neck supple.  Cardiovascular: Normal rate, regular rhythm and normal heart sounds.   Pulmonary/Chest: Effort normal. No respiratory distress. She has no rales. She exhibits tenderness.  Poor inspiration and expiration when asked  forcefully blow air out with her mouth open. I am uncertain as to whether she actually understands or is unable to do so. There is very little air movement there is auscultated and when placing my hand over her mouth on forced expiration I feel no air. There is a faint wheeze  with inspiration but there also throaty sounds which mask to airway sounds.  Musculoskeletal: Normal range of motion. She exhibits no edema.   Lymphadenopathy:    She has no cervical adenopathy.  Neurological: She is alert and oriented to person, place, and time.  Skin: Skin is warm and dry. No rash noted.  Psychiatric: She has a normal mood and affect.    ED Course  Procedures (including critical care time) Labs Review Labs Reviewed - No data to display Imaging Review No results found.    MDM   Final diagnoses:  URI (upper respiratory infection)  RAD (reactive airway disease) with wheezing    Status post DuoNeb administration patient has much improved air movement. No additional wheezing heard. She has no coughing now. I suspect with the minor URI symptoms she developed a reactive airway disease with bronchospasm. Treatment with prednisone po Albuterol HFA claritin or Allegra daily    Hayden Rasmussenavid Eino Whitner, NP 11/16/13 1244

## 2013-11-16 NOTE — ED Notes (Signed)
C/o chest pain and vomiting States she has been wheezing at night but will cough when she wakes up.   States chest pain is coming from not able to breathe States she did move furniture last night.   States she has used inhaler since 2013.

## 2013-11-17 NOTE — ED Provider Notes (Signed)
Medical screening examination/treatment/procedure(s) were performed by non-physician practitioner and as supervising physician I was immediately available for consultation/collaboration.  Leslee Homeavid Immanuel Fedak, M.D.  Reuben Likesavid C Swetha Rayle, MD 11/17/13 1230

## 2014-08-18 ENCOUNTER — Emergency Department (HOSPITAL_COMMUNITY)
Admission: EM | Admit: 2014-08-18 | Discharge: 2014-08-18 | Disposition: A | Discharge status: Home / Self Care | Payer: Medicaid Other | Attending: Emergency Medicine | Admitting: Emergency Medicine

## 2014-08-18 ENCOUNTER — Encounter (HOSPITAL_COMMUNITY): Payer: Self-pay | Admitting: *Deleted

## 2014-08-18 DIAGNOSIS — R0981 Nasal congestion: Secondary | ICD-10-CM | POA: Insufficient documentation

## 2014-08-18 DIAGNOSIS — Z79899 Other long term (current) drug therapy: Secondary | ICD-10-CM | POA: Insufficient documentation

## 2014-08-18 DIAGNOSIS — H66001 Acute suppurative otitis media without spontaneous rupture of ear drum, right ear: Secondary | ICD-10-CM | POA: Diagnosis not present

## 2014-08-18 DIAGNOSIS — H9201 Otalgia, right ear: Secondary | ICD-10-CM | POA: Diagnosis present

## 2014-08-18 DIAGNOSIS — G40909 Epilepsy, unspecified, not intractable, without status epilepticus: Secondary | ICD-10-CM | POA: Insufficient documentation

## 2014-08-18 MED ORDER — IBUPROFEN 800 MG PO TABS: 800.0000 mg | ORAL_TABLET | Freq: Three times a day (TID) | ORAL | Status: DC

## 2014-08-18 MED ORDER — AMOXICILLIN 500 MG PO CAPS: 1000.0000 mg | ORAL_CAPSULE | Freq: Two times a day (BID) | ORAL | Status: DC

## 2014-08-18 NOTE — ED Notes (Signed)
Pt complains of right ear pain today, pt is tearful in triage, pt's ear is red and inflammed

## 2014-08-18 NOTE — Discharge Instructions (Signed)

## 2014-08-18 NOTE — ED Notes (Signed)
Pt reports R ear pain which started today.  Pt's grandma reports putting eardrops in her R ear, reports pt heard a "pop".  Then she started to have R facial numbness.

## 2014-08-18 NOTE — ED Provider Notes (Signed)
CSN: [id number]     Arrival date & time 08/18/14  2124 History   None    Chief Complaint  Patient presents with  . Otalgia   HPI This chart was scribed for non-physician practitioner, Elpidio AnisShari Bernyce Brimley, PA-C, working with No att. providers found, by Andrew Auaven Small, ED Scribe. This patient was seen in room WTR5/WTR5 and the patient's care was started at 11:18 PM.  Brenda Vargas is a 23 y.o. female who presents to the Emergency Department complaining of right ear pain that began this afternoon. Pt states she had mild congestion this morning. Pt has not taken medication.  Pt reports hx of ear infection when younger. Pt denies fever. Pt is allergic to codeine.   Past Medical History  Diagnosis Date  . Seizure    Past Surgical History  Procedure Laterality Date  . Knee arthroscopy w/ acl reconstruction      L knee   No family history on file. History  Substance Use Topics  . Smoking status: Never Smoker   . Smokeless tobacco: Not on file  . Alcohol Use: No   OB History    No data available     Review of Systems  Constitutional: Negative for fever and chills.  HENT: Positive for congestion and ear pain.     Allergies  Codeine  Home Medications   Prior to Admission medications   Medication Sig Start Date End Date Taking? Authorizing Provider  albuterol (PROVENTIL HFA;VENTOLIN HFA) 108 (90 BASE) MCG/ACT inhaler Inhale 1-2 puffs into the lungs every 6 (six) hours as needed for wheezing. 10/31/12   Reuben Likesavid C Keller, MD  albuterol (PROVENTIL HFA;VENTOLIN HFA) 108 (90 BASE) MCG/ACT inhaler Inhale 2 puffs into the lungs every 4 (four) hours as needed for wheezing or shortness of breath. 11/16/13   Hayden Rasmussenavid Mabe, NP  azithromycin (ZITHROMAX Z-PAK) 250 MG tablet Take as directed. 10/31/12   Reuben Likesavid C Keller, MD  fexofenadine (ALLEGRA) 180 MG tablet Take 1 tablet (180 mg total) by mouth daily. 10/31/12   Reuben Likesavid C Keller, MD  fexofenadine (ALLEGRA) 180 MG tablet Take 1 tablet (180 mg total) by mouth  daily. 11/16/13   Hayden Rasmussenavid Mabe, NP  methylPREDNISolone (MEDROL DOSEPAK) 4 MG tablet follow package directions 11/16/13   Hayden Rasmussenavid Mabe, NP  topiramate (TOPAMAX) 100 MG tablet Take 100 mg by mouth 2 (two) times daily.    Historical Provider, MD  traMADol (ULTRAM) 50 MG tablet Take 2 tablets (100 mg total) by mouth every 8 (eight) hours as needed for pain. 10/31/12   Reuben Likesavid C Keller, MD   BP 117/85 mmHg  Pulse 103  Temp(Src) 98.4 F (36.9 C) (Oral)  Resp 18  SpO2 100%  LMP 08/17/2014 Physical Exam  Constitutional: She is oriented to person, place, and time. She appears well-developed and well-nourished. No distress.  HENT:  Head: Normocephalic and atraumatic.  Nose: No mucosal edema.  Right TM red with purulent appearing middle ear. No pain external ear movement. Oropharynx benign   Eyes: Conjunctivae and EOM are normal.  Neck: Neck supple.  Cardiovascular: Normal rate, regular rhythm and normal heart sounds.  Exam reveals no gallop and no friction rub.   No murmur heard. Pulmonary/Chest: Effort normal and breath sounds normal. No respiratory distress. She has no wheezes. She has no rales. She exhibits no tenderness.  Musculoskeletal: Normal range of motion.  Neurological: She is alert and oriented to person, place, and time.  Skin: Skin is warm and dry.  Psychiatric: She has a normal  mood and affect. Her behavior is normal.  Nursing note and vitals reviewed.   ED Course  Procedures (including critical care time) DIAGNOSTIC STUDIES: Oxygen Saturation is 100% on RA, normal by my interpretation.    COORDINATION OF CARE: 11:17 PM- Pt advised of plan for treatment and pt agrees.  Labs Review Labs Reviewed - No data to display  Imaging Review No results found.   EKG Interpretation None      MDM   Final diagnoses:  None   I personally performed the services described in this documentation, which was scribed in my presence. The recorded information has been reviewed and is  accurate.    1. Otitis media  Uncomplicated otitis treatable with abx, PCP follow up.    Arnoldo HookerShari A Braxen Dobek, PA-C 08/19/14 [id number]  Tomasita CrumbleAdeleke Oni, MD 08/19/14 [phone]

## 2014-11-22 ENCOUNTER — Other Ambulatory Visit: Payer: Self-pay

## 2014-11-22 ENCOUNTER — Emergency Department (HOSPITAL_COMMUNITY)
Admission: EM | Admit: 2014-11-22 | Discharge: 2014-11-22 | Disposition: A | Discharge status: Home / Self Care | Payer: Medicaid Other | Source: Home / Self Care | Attending: Emergency Medicine | Admitting: Emergency Medicine

## 2014-11-22 ENCOUNTER — Encounter (HOSPITAL_COMMUNITY): Payer: Self-pay | Admitting: Emergency Medicine

## 2014-11-22 DIAGNOSIS — J069 Acute upper respiratory infection, unspecified: Secondary | ICD-10-CM

## 2014-11-22 LAB — POCT RAPID STREP A: Streptococcus, Group A Screen (Direct): NEGATIVE

## 2014-11-22 MED ORDER — NAPROXEN 500 MG PO TABS: 500.0000 mg | ORAL_TABLET | Freq: Two times a day (BID) | ORAL | Status: DC

## 2014-11-22 MED ORDER — IPRATROPIUM BROMIDE 0.06 % NA SOLN: 2.0000 | Freq: Four times a day (QID) | NASAL | Status: DC

## 2014-11-22 MED ORDER — NAPROXEN 500 MG PO TABS
500.0000 mg | ORAL_TABLET | Freq: Two times a day (BID) | ORAL | Status: DC
Start: 2014-11-22 — End: 2015-03-12

## 2014-11-22 MED ORDER — ALBUTEROL SULFATE HFA 108 (90 BASE) MCG/ACT IN AERS: 1.0000 | INHALATION_SPRAY | Freq: Four times a day (QID) | RESPIRATORY_TRACT | Status: DC | PRN

## 2014-11-22 MED ORDER — BENZONATATE 200 MG PO CAPS: 200.0000 mg | ORAL_CAPSULE | Freq: Three times a day (TID) | ORAL | Status: DC | PRN

## 2014-11-22 NOTE — Discharge Instructions (Signed)

## 2014-11-22 NOTE — ED Provider Notes (Signed)
Chief Complaint   Sore Throat   History of Present Illness   Brenda Vargas is a 24 year old female who's had a two-day history of sore throat, nausea, vomiting, nasal congestion with yellowish rhinorrhea, ear congestion, chills, dry cough, and wheezing.  Review of Systems   Other than as noted above, the patient denies any of the following symptoms: Systemic:  No fevers, chills, sweats, or myalgias. Eye:  No redness or discharge. ENT:  No ear pain, headache, nasal congestion, drainage, sinus pressure, or sore throat. Neck:  No neck pain, stiffness, or swollen glands. Lungs:  No cough, sputum production, hemoptysis, wheezing, chest tightness, shortness of breath or chest pain. GI:  No abdominal pain, nausea, vomiting or diarrhea.  PMFSH   Past medical history, family history, social history, meds, and allergies were reviewed. She has a history of seizures, asthma, and allergies and takes Topamax and albuterol. She's allergic to codeine.  Physical exam   Vital signs:  BP 108/58 mmHg  Pulse 79  Temp(Src) 97.5 F (36.4 C) (Oral)  Resp 16  SpO2 100%  LMP 11/15/2014 General:  Alert and oriented.  In no distress.  Skin warm and dry. Eye:  No conjunctival injection or drainage. Lids were normal. ENT:  TMs and canals were normal, without erythema or inflammation.  Nasal mucosa was clear and uncongested, without drainage.  Mucous membranes were moist.  Pharynx was clear with no exudate or drainage.  There were no oral ulcerations or lesions. Neck:  Supple, no adenopathy, tenderness or mass. Lungs:  No respiratory distress.  Lungs were clear to auscultation, without wheezes, rales or rhonchi.  Breath sounds were clear and equal bilaterally.  Heart:  Regular rhythm, without gallops, murmers or rubs. Skin:  Clear, warm, and dry, without rash or lesions.  Labs   Results for orders placed or performed during the hospital encounter of 11/22/14  POCT rapid strep A [facility])    Result Value Ref Range   Streptococcus, Group A Screen (Direct) NEGATIVE NEGATIVE    Assessment     The encounter diagnosis was Viral URI.  There is no evidence of pneumonia, strep throat, sinusitis, otitis media.    Plan    1.  Meds:  The following meds were prescribed:   Discharge Medication List as of 11/22/2014  4:52 PM    START taking these medications   Details  !! albuterol (PROVENTIL HFA;VENTOLIN HFA) 108 (90 BASE) MCG/ACT inhaler Inhale 1-2 puffs into the lungs every 6 (six) hours as needed for wheezing or shortness of breath., Starting 11/22/2014, Until Discontinued, Normal     !! - Potential duplicate medications found. Please discuss with provider.     She was also given Tessalon Perles 200 mg, #30, one 3 times a day as needed for cough, naproxen 500 mg, #20, 1 twice a day for sore throat, and ipratropium nasal spray 0.06%, 2 sprays in each nostril 4 times a day.  2.  Patient Education/Counseling:  The patient was given appropriate handouts, self care instructions, and instructed in symptomatic relief.  Instructed to get extra fluids and extra rest.    3.  Follow up:  The patient was told to follow up here if no better in 3 to 4 days, or sooner if becoming worse in any way, and given some red flag symptoms such as increasing fever, difficulty breathing, chest pain, or persistent vomiting which would prompt immediate return.       Reuben Likesavid C Darivs Lunden, MD 11/22/14 [phone]

## 2014-11-22 NOTE — ED Notes (Signed)
C/o sore throat onset yesterday.  Vomited x1 yesterday.  Then she got a runny nose, chills but no fever.

## 2014-11-25 LAB — CULTURE, GROUP A STREP: Strep A Culture: NEGATIVE

## 2015-03-12 ENCOUNTER — Emergency Department (HOSPITAL_COMMUNITY)
Admission: EM | Admit: 2015-03-12 | Discharge: 2015-03-12 | Disposition: A | Discharge status: Home / Self Care | Payer: Medicaid Other | Source: Home / Self Care | Attending: Family Medicine | Admitting: Family Medicine

## 2015-03-12 ENCOUNTER — Encounter (HOSPITAL_COMMUNITY): Payer: Self-pay | Admitting: Emergency Medicine

## 2015-03-12 DIAGNOSIS — L237 Allergic contact dermatitis due to plants, except food: Secondary | ICD-10-CM | POA: Diagnosis not present

## 2015-03-12 MED ORDER — PREDNISONE 5 MG (48) PO TBPK
5.0000 mg | ORAL_TABLET | Freq: Every day | ORAL | Status: DC
Start: 2015-03-12 — End: 2015-11-13

## 2015-03-12 MED ORDER — TRIAMCINOLONE ACETONIDE 0.5 % EX OINT: 1.0000 "application " | TOPICAL_OINTMENT | Freq: Two times a day (BID) | CUTANEOUS | Status: DC

## 2015-03-12 NOTE — Discharge Instructions (Signed)
Thank you for coming in today. Use the cream.  If not getting better take the prednisone.  Call or go to the emergency room if you get worse, have trouble breathing, have chest pains, or palpitations.   Poison [employer] ivy is a inflammation of the skin (contact dermatitis) caused by touching the allergens on the leaves of the ivy plant following previous exposure to the plant. The rash usually appears 48 hours after exposure. The rash is usually bumps (papules) or blisters (vesicles) in a linear pattern. Depending on your own sensitivity, the rash may simply cause redness and itching, or it may also progress to blisters which may break open. These must be well cared for to prevent secondary bacterial (germ) infection, followed by scarring. Keep any open areas dry, clean, dressed, and covered with an antibacterial ointment if needed. The eyes may also get puffy. The puffiness is worst in the morning and gets better as the day progresses. This dermatitis usually heals without scarring, within 2 to 3 weeks without treatment. HOME CARE INSTRUCTIONS  Thoroughly wash with soap and water as soon as you have been exposed to poison ivy. You have about one half hour to remove the plant resin before it will cause the rash. This washing will destroy the oil or antigen on the skin that is causing, or will cause, the rash. Be sure to wash under your fingernails as any plant resin there will continue to spread the rash. Do not rub skin vigorously when washing affected area. Poison ivy cannot spread if no oil from the plant remains on your body. A rash that has progressed to weeping sores will not spread the rash unless you have not washed thoroughly. It is also important to wash any clothes you have been wearing as these may carry active allergens. The rash will return if you wear the unwashed clothing, even several days later. Avoidance of the plant in the future is the best measure. Poison ivy plant can be recognized  by the number of leaves. Generally, poison ivy has three leaves with flowering branches on a single stem. Diphenhydramine may be purchased over the counter and used as needed for itching. Do not drive with this medication if it makes you drowsy.Ask your caregiver about medication for children. SEEK MEDICAL CARE IF:  Open sores develop.  Redness spreads beyond area of rash.  You notice purulent (pus-like) discharge.  You have increased pain.  Other signs of infection develop (such as fever). Document Released: 09/09/2000 Document Revised: 12/05/2011 Document Reviewed: 02/20/2009 [facility] Patient Information 2015 [place], [place]. This information is not intended to replace advice given to you by your health care provider. Make sure you discuss any questions you have with your health care provider.

## 2015-03-12 NOTE — ED Notes (Signed)
Pt states that  She got poison ivy on 03/08/2015 on her left arm and lower abdomen.

## 2015-03-12 NOTE — ED Provider Notes (Signed)
Brenda Vargas is a 24 y.o. female who presents to Urgent Care today for poison ivy dermatitis of the left arm starting 5 days ago. Also occurring on the left flank. Patient is use Benadryl and calamine lotion which helped a little. No fevers or chills nausea vomiting or diarrhea. No new soaps urgent shampoo cosmetics etc. No new medications.   Past Medical History  Diagnosis Date  . Seizure    Past Surgical History  Procedure Laterality Date  . Knee arthroscopy w/ acl reconstruction  2004    L knee   History  Substance Use Topics  . Smoking status: Never Smoker   . Smokeless tobacco: Not on file  . Alcohol Use: No   ROS as above Medications: No current facility-administered medications for this encounter.   Current Outpatient Prescriptions  Medication Sig Dispense Refill  . albuterol (PROVENTIL HFA;VENTOLIN HFA) 108 (90 BASE) MCG/ACT inhaler Inhale 1-2 puffs into the lungs every 6 (six) hours as needed for wheezing. 1 Inhaler 0  . albuterol (PROVENTIL HFA;VENTOLIN HFA) 108 (90 BASE) MCG/ACT inhaler Inhale 2 puffs into the lungs every 4 (four) hours as needed for wheezing or shortness of breath. 1 Inhaler 0  . albuterol (PROVENTIL HFA;VENTOLIN HFA) 108 (90 BASE) MCG/ACT inhaler Inhale 1-2 puffs into the lungs every 6 (six) hours as needed for wheezing or shortness of breath. 1 Inhaler 3  . ibuprofen (ADVIL,MOTRIN) 800 MG tablet Take 1 tablet (800 mg total) by mouth 3 (three) times daily. 21 tablet 0  . ipratropium (ATROVENT) 0.06 % nasal spray Place 2 sprays into both nostrils 4 (four) times daily. 15 mL 12  . predniSONE (STERAPRED UNI-PAK 48 TAB) 5 MG (48) TBPK tablet Take 1 tablet (5 mg total) by mouth daily. 12 day dosepack 48 tablet 0  . topiramate (TOPAMAX) 100 MG tablet Take 100 mg by mouth 2 (two) times daily.    Marland Kitchen triamcinolone ointment (KENALOG) 0.5 % Apply 1 application topically 2 (two) times daily. 60 g 1  . [DISCONTINUED] fexofenadine (ALLEGRA) 180 MG tablet Take 1 tablet  (180 mg total) by mouth daily. (Patient not taking: Reported on 08/18/2014) 14 tablet 0   Allergies  Allergen Reactions  . Codeine Rash     Exam:  BP 116/81 mmHg  Pulse 71  Temp(Src) 98.2 F (36.8 C) (Oral)  Resp 20  SpO2 100%  LMP 03/08/2015 Gen: Well NAD HEENT: EOMI,  MMM Lungs: Normal work of breathing. CTABL Heart: RRR no MRG Abd: NABS, Soft. Nondistended, Nontender Exts: Brisk capillary refill, warm and well perfused.  Skin: Erythematous vesicular excoriated rash left arm and hand  No results found for this or any previous visit (from the past 24 hour(s)). No results found.  Assessment and Plan: 24 y.o. female with poison ivy dermatitis. Treat with triamcinolone ointment. If not better use prednisone  Discussed warning signs or symptoms. Please see discharge instructions. Patient expresses understanding.     Rodolph Bong, MD 03/12/15 [phone]

## 2015-07-16 ENCOUNTER — Emergency Department (HOSPITAL_COMMUNITY): Payer: Medicaid Other

## 2015-07-16 ENCOUNTER — Emergency Department (HOSPITAL_COMMUNITY)
Admission: EM | Admit: 2015-07-16 | Discharge: 2015-07-16 | Disposition: A | Discharge status: Home / Self Care | Payer: Medicaid Other | Attending: Emergency Medicine | Admitting: Emergency Medicine

## 2015-07-16 ENCOUNTER — Encounter (HOSPITAL_COMMUNITY): Payer: Self-pay | Admitting: *Deleted

## 2015-07-16 DIAGNOSIS — Z79899 Other long term (current) drug therapy: Secondary | ICD-10-CM | POA: Diagnosis not present

## 2015-07-16 DIAGNOSIS — K219 Gastro-esophageal reflux disease without esophagitis: Secondary | ICD-10-CM | POA: Insufficient documentation

## 2015-07-16 DIAGNOSIS — Z3202 Encounter for pregnancy test, result negative: Secondary | ICD-10-CM | POA: Insufficient documentation

## 2015-07-16 DIAGNOSIS — R109 Unspecified abdominal pain: Secondary | ICD-10-CM | POA: Diagnosis present

## 2015-07-16 DIAGNOSIS — N201 Calculus of ureter: Secondary | ICD-10-CM | POA: Insufficient documentation

## 2015-07-16 DIAGNOSIS — G40909 Epilepsy, unspecified, not intractable, without status epilepticus: Secondary | ICD-10-CM | POA: Insufficient documentation

## 2015-07-16 HISTORY — DX: Gastro-esophageal reflux disease without esophagitis: K21.9

## 2015-07-16 LAB — URINALYSIS, ROUTINE W REFLEX MICROSCOPIC
BILIRUBIN URINE: NEGATIVE
GLUCOSE, UA: NEGATIVE mg/dL
KETONES UR: NEGATIVE mg/dL
Leukocytes, UA: NEGATIVE
Nitrite: NEGATIVE
PROTEIN: NEGATIVE mg/dL
Specific Gravity, Urine: 1.025 (ref 1.005–1.030)
Urobilinogen, UA: 0.2 mg/dL (ref 0.0–1.0)
pH: 5.5 (ref 5.0–8.0)

## 2015-07-16 LAB — PREGNANCY, URINE: Preg Test, Ur: NEGATIVE

## 2015-07-16 LAB — URINE MICROSCOPIC-ADD ON

## 2015-07-16 MED ORDER — OXYCODONE-ACETAMINOPHEN 5-325 MG PO TABS
1.0000 | ORAL_TABLET | Freq: Once | ORAL | Status: AC
Start: 2015-07-16 — End: 2015-07-16
  Administered 2015-07-16: 1 via ORAL
  Filled 2015-07-16: qty 1

## 2015-07-16 MED ORDER — ONDANSETRON HCL 4 MG PO TABS
4.0000 mg | ORAL_TABLET | Freq: Once | ORAL | Status: AC
  Administered 2015-07-16: 4 mg via ORAL
  Filled 2015-07-16: qty 1

## 2015-07-16 MED ORDER — OXYCODONE-ACETAMINOPHEN 5-325 MG PO TABS: 1.0000 | ORAL_TABLET | ORAL | Status: DC | PRN

## 2015-07-16 MED ORDER — ONDANSETRON HCL 4 MG PO TABS: 4.0000 mg | ORAL_TABLET | Freq: Four times a day (QID) | ORAL | Status: DC

## 2015-07-16 NOTE — ED Notes (Signed)
Patient returned from CT

## 2015-07-16 NOTE — ED Notes (Signed)
Pt states that she woke up with rt flank pain around 145 this am; pt states that it was to her rt flank area that radiated to her rt lower abd; pt c/o lower abd / bladder pain; pt states that the pain is more intense earlier; pt c/o nausea with no vomiting; pt states that she was hyperventilating earlier from the pain

## 2015-07-16 NOTE — ED Provider Notes (Signed)
CSN: [id number]     Arrival date & time 07/16/15  0425 History   First MD Initiated Contact with Patient 07/16/15 [phone]     Chief Complaint  Patient presents with  . Flank Pain     (Consider location/radiation/quality/duration/timing/severity/associated sxs/prior Treatment) Patient is a 24 y.o. female presenting with flank pain. The history is provided by the patient and a parent. No language interpreter was used.  Flank Pain This is a new problem. The current episode started today. Associated symptoms include abdominal pain and nausea. Pertinent negatives include no chest pain, fever, myalgias or vomiting. Associated symptoms comments: Onset left flank pain during the night that now radiates into the LLQ abdomen. She states she saw blood in her urine last night. No fever. Nausea without vomiting. No history of kidney stone. .    Past Medical History  Diagnosis Date  . Seizure (HCC)   . GERD (gastroesophageal reflux disease)    Past Surgical History  Procedure Laterality Date  . Knee arthroscopy w/ acl reconstruction  2004    L knee   Family History  Problem Relation Age of Onset  . Diabetes Mother   . Diabetes Father    Social History  Substance Use Topics  . Smoking status: Never Smoker   . Smokeless tobacco: None  . Alcohol Use: No   OB History    No data available     Review of Systems  Constitutional: Negative for fever.  Respiratory: Negative for shortness of breath.   Cardiovascular: Negative for chest pain.  Gastrointestinal: Positive for nausea and abdominal pain. Negative for vomiting.  Genitourinary: Positive for hematuria and flank pain. Negative for dysuria and vaginal discharge.  Musculoskeletal: Negative for myalgias.  Skin: Negative for color change.  Neurological: Negative.       Allergies  Codeine  Home Medications   Prior to Admission medications   Medication Sig Start Date End Date Taking? Authorizing Provider  albuterol (PROVENTIL  HFA;VENTOLIN HFA) 108 (90 BASE) MCG/ACT inhaler Inhale 1-2 puffs into the lungs every 6 (six) hours as needed for wheezing or shortness of breath. 11/22/14  Yes Reuben Likes, MD  ibuprofen (ADVIL,MOTRIN) 200 MG tablet Take 400 mg by mouth every 6 (six) hours as needed for moderate pain.   Yes Historical Provider, MD  omeprazole (PRILOSEC) 40 MG capsule Take 40 mg by mouth daily.   Yes Historical Provider, MD  topiramate (TOPAMAX) 100 MG tablet Take 100 mg by mouth 2 (two) times daily.   Yes Historical Provider, MD  albuterol (PROVENTIL HFA;VENTOLIN HFA) 108 (90 BASE) MCG/ACT inhaler Inhale 1-2 puffs into the lungs every 6 (six) hours as needed for wheezing. Patient not taking: Reported on 07/16/2015 10/31/12   Reuben Likes, MD  albuterol (PROVENTIL HFA;VENTOLIN HFA) 108 (90 BASE) MCG/ACT inhaler Inhale 2 puffs into the lungs every 4 (four) hours as needed for wheezing or shortness of breath. Patient not taking: Reported on 07/16/2015 11/16/13   Hayden Rasmussen, NP  ibuprofen (ADVIL,MOTRIN) 800 MG tablet Take 1 tablet (800 mg total) by mouth 3 (three) times daily. Patient not taking: Reported on 07/16/2015 08/18/14   Elpidio Anis, PA-C  ipratropium (ATROVENT) 0.06 % nasal spray Place 2 sprays into both nostrils 4 (four) times daily. Patient not taking: Reported on 07/16/2015 11/22/14   Reuben Likes, MD  predniSONE (STERAPRED UNI-PAK 48 TAB) 5 MG (48) TBPK tablet Take 1 tablet (5 mg total) by mouth daily. 12 day dosepack Patient not taking: Reported on 07/16/2015 03/12/15  Rodolph Bong, MD  triamcinolone ointment (KENALOG) 0.5 % Apply 1 application topically 2 (two) times daily. Patient not taking: Reported on 07/16/2015 03/12/15   Rodolph Bong, MD   BP 126/84 mmHg  Pulse 86  Temp(Src) 97.6 F (36.4 C) (Oral)  Resp 20  Ht  (1.626 m)  Wt 233 lb 9.6 oz (105.96 kg)  BMI 40.08 kg/m2  SpO2 98%  LMP 06/23/2015 Physical Exam  Constitutional: She is oriented to person, place, and time. She appears  well-developed and well-nourished.  Patient appears in NAD.  Neck: Normal range of motion.  Pulmonary/Chest: Effort normal.  Abdominal: Soft. There is no tenderness. There is no rebound.  Genitourinary:  Mild TTP left flank.  Neurological: She is alert and oriented to person, place, and time.  Skin: Skin is warm and dry.  Psychiatric: She has a normal mood and affect.    ED Course  Procedures (including critical care time) Labs Review Labs Reviewed  URINALYSIS, ROUTINE W REFLEX MICROSCOPIC (NOT AT [facility]) - Abnormal; Notable for the following:    APPearance CLOUDY (*)    Hgb urine dipstick LARGE (*)    All other components within normal limits  PREGNANCY, URINE  URINE MICROSCOPIC-ADD ON   Results for orders placed or performed during the hospital encounter of 07/16/15  Urinalysis, Routine w reflex microscopic  Result Value Ref Range   Color, Urine YELLOW YELLOW   APPearance CLOUDY (A) CLEAR   Specific Gravity, Urine 1.025 1.005 - 1.030   pH 5.5 5.0 - 8.0   Glucose, UA NEGATIVE NEGATIVE mg/dL   Hgb urine dipstick LARGE (A) NEGATIVE   Bilirubin Urine NEGATIVE NEGATIVE   Ketones, ur NEGATIVE NEGATIVE mg/dL   Protein, ur NEGATIVE NEGATIVE mg/dL   Urobilinogen, UA 0.2 0.0 - 1.0 mg/dL   Nitrite NEGATIVE NEGATIVE   Leukocytes, UA NEGATIVE NEGATIVE  Pregnancy, urine  Result Value Ref Range   Preg Test, Ur NEGATIVE NEGATIVE  Urine microscopic-add on  Result Value Ref Range   WBC, UA 0-2 <3 WBC/hpf   RBC / HPF 21-50 <3 RBC/hpf   Bacteria, UA RARE RARE   Urine-Other MUCOUS PRESENT    Ct Renal Stone Study  07/16/2015  CLINICAL DATA:  Acute onset of left flank pain and nausea. Initial encounter. EXAM: CT ABDOMEN AND PELVIS WITHOUT CONTRAST TECHNIQUE: Multidetector CT imaging of the abdomen and pelvis was performed following the standard protocol without IV contrast. COMPARISON:  Right upper quadrant ultrasound performed 05/30/2012 FINDINGS: The visualized lung bases are clear. The  liver and spleen are unremarkable in appearance. The gallbladder is within normal limits. The pancreas and adrenal glands are unremarkable. There is minimal left-sided hydronephrosis, with an obstructing 5 x 3 mm stone in the distal left ureter, just above the left vesicoureteral junction. A nonobstructing 7 mm stone is noted at the interpole region of the right kidney. The kidneys are otherwise unremarkable. No free fluid is identified. The small bowel is unremarkable in appearance. The stomach is within normal limits. No acute vascular abnormalities are seen. The appendix is normal in caliber, without evidence of appendicitis. The colon is unremarkable in appearance. The bladder is decompressed and not well assessed. The uterus is unremarkable in appearance. The ovaries are relatively symmetric. No suspicious adnexal masses are seen. No inguinal lymphadenopathy is seen. No acute osseous abnormalities are identified. IMPRESSION: 1. Minimal left-sided hydronephrosis, with an obstructing 5 x 3 mm stone in the distal left ureter, just above the left vesicoureteral junction. 2.  Nonobstructing 7 mm stone at the interpole region of the right kidney. Electronically Signed   By: Roanna RaiderJeffery  Chang M.D.   On: 07/16/2015 05:47    Imaging Review No results found. I have personally reviewed and evaluated these images and lab results as part of my medical decision-making.   EKG Interpretation None      MDM   Final diagnoses:  None    1. Left ureteral stone  Patient appears comfortable, has had no vomiting and has normal VS. There is a 3 x 5 mm stone in distal ureter. Will refer to urology for further management and provide pain and nausea medication for home use.     Elpidio AnisShari Jynesis Nakamura, PA-C 07/16/15 [phone]  Devoria AlbeIva Knapp, MD 07/16/15 [phone]

## 2015-07-16 NOTE — Discharge Instructions (Signed)
Kidney Stones °Kidney stones (urolithiasis) are deposits that form inside your kidneys. The intense pain is caused by the stone moving through the urinary tract. When the stone moves, the ureter goes into spasm around the stone. The stone is usually passed in the urine.  °CAUSES  °· A disorder that makes certain neck glands produce too much parathyroid hormone (primary hyperparathyroidism). °· A buildup of uric acid crystals, similar to gout in your joints. °· Narrowing (stricture) of the ureter. °· A kidney obstruction present at birth (congenital obstruction). °· Previous surgery on the kidney or ureters. °· Numerous kidney infections. °SYMPTOMS  °· Feeling sick to your stomach (nauseous). °· Throwing up (vomiting). °· Blood in the urine (hematuria). °· Pain that usually spreads (radiates) to the groin. °· Frequency or urgency of urination. °DIAGNOSIS  °· Taking a history and physical exam. °· Blood or urine tests. °· CT scan. °· Occasionally, an examination of the inside of the urinary bladder (cystoscopy) is performed. °TREATMENT  °· Observation. °· Increasing your fluid intake. °· Extracorporeal shock wave lithotripsy--This is a noninvasive procedure that uses shock waves to break up kidney stones. °· Surgery may be needed if you have severe pain or persistent obstruction. There are various surgical procedures. Most of the procedures are performed with the use of small instruments. Only small incisions are needed to accommodate these instruments, so recovery time is minimized. °The size, location, and chemical composition are all important variables that will determine the proper choice of action for you. Talk to your health care provider to better understand your situation so that you will minimize the risk of injury to yourself and your kidney.  °HOME CARE INSTRUCTIONS  °· Drink enough water and fluids to keep your urine clear or pale yellow. This will help you to pass the stone or stone fragments. °· Strain  all urine through the provided strainer. Keep all particulate matter and stones for your health care provider to see. The stone causing the pain may be as small as a grain of salt. It is very important to use the strainer each and every time you pass your urine. The collection of your stone will allow your health care provider to analyze it and verify that a stone has actually passed. The stone analysis will often identify what you can do to reduce the incidence of recurrences. °· Only take over-the-counter or prescription medicines for pain, discomfort, or fever as directed by your health care provider. °· Keep all follow-up visits as told by your health care provider. This is important. °· Get follow-up X-rays if required. The absence of pain does not always mean that the stone has passed. It may have only stopped moving. If the urine remains completely obstructed, it can cause loss of kidney function or even complete destruction of the kidney. It is your responsibility to make sure X-rays and follow-ups are completed. Ultrasounds of the kidney can show blockages and the status of the kidney. Ultrasounds are not associated with any radiation and can be performed easily in a matter of minutes. °· Make changes to your daily diet as told by your health care provider. You may be told to: °¨ Limit the amount of salt that you eat. °¨ Eat 5 or more servings of fruits and vegetables each day. °¨ Limit the amount of meat, poultry, fish, and eggs that you eat. °· Collect a 24-hour urine sample as told by your health care provider. You may need to collect another urine sample every 6-12   months. °SEEK MEDICAL CARE IF: °· You experience pain that is progressive and unresponsive to any pain medicine you have been prescribed. °SEEK IMMEDIATE MEDICAL CARE IF:  °· Pain cannot be controlled with the prescribed medicine. °· You have a fever or shaking chills. °· The severity or intensity of pain increases over 18 hours and is not  relieved by pain medicine. °· You develop a new onset of abdominal pain. °· You feel faint or pass out. °· You are unable to urinate. °  °This information is not intended to replace advice given to you by your health care provider. Make sure you discuss any questions you have with your health care provider. °  °Document Released: 09/12/2005 Document Revised: 06/03/2015 Document Reviewed: 02/13/2013 °Elsevier Interactive Patient Education ©2016 [employer] ° °

## 2015-07-16 NOTE — ED Notes (Signed)
PA at bedside.  Patient c/o left flank pain, radiates to bladder. Denies having this pain before. Patient reports dysuria and increased frequency. A&Ox4.

## 2015-11-13 ENCOUNTER — Emergency Department (INDEPENDENT_AMBULATORY_CARE_PROVIDER_SITE_OTHER)
Admission: EM | Admit: 2015-11-13 | Discharge: 2015-11-13 | Disposition: A | Discharge status: Home / Self Care | Payer: Medicaid Other | Source: Home / Self Care | Attending: Family Medicine | Admitting: Family Medicine

## 2015-11-13 ENCOUNTER — Encounter (HOSPITAL_COMMUNITY): Payer: Self-pay | Admitting: Emergency Medicine

## 2015-11-13 DIAGNOSIS — J069 Acute upper respiratory infection, unspecified: Secondary | ICD-10-CM

## 2015-11-13 DIAGNOSIS — J4531 Mild persistent asthma with (acute) exacerbation: Secondary | ICD-10-CM | POA: Diagnosis not present

## 2015-11-13 MED ORDER — IPRATROPIUM-ALBUTEROL 0.5-2.5 (3) MG/3ML IN SOLN
3.0000 mL | Freq: Once | RESPIRATORY_TRACT | Status: AC
  Administered 2015-11-13: 3 mL via RESPIRATORY_TRACT

## 2015-11-13 MED ORDER — PREDNISONE 20 MG PO TABS: ORAL_TABLET | ORAL | Status: DC

## 2015-11-13 MED ORDER — IPRATROPIUM-ALBUTEROL 0.5-2.5 (3) MG/3ML IN SOLN
RESPIRATORY_TRACT | Status: AC
  Filled 2015-11-13: qty 3

## 2015-11-13 NOTE — ED Notes (Signed)
C/o cold sx onset x4 associated w/couigh, ST, chest d/c, odynophagia, and runny nose Taking OTC A&O x4... No acute disterss

## 2015-11-13 NOTE — Discharge Instructions (Signed)
Asthma Attack Prevention °While you may not be able to control the fact that you have asthma, you can take actions to prevent asthma attacks. The best way to prevent asthma attacks is to maintain good control of your asthma. You can achieve this by: °· Taking your medicines as directed. °· Avoiding things that can irritate your airways or make your asthma symptoms worse (asthma triggers). °· Keeping track of how well your asthma is controlled and of any changes in your symptoms. °· Responding quickly to worsening asthma symptoms (asthma attack). °· Seeking emergency care when it is needed. °WHAT ARE SOME WAYS TO PREVENT AN ASTHMA ATTACK? °Have a Plan °Work with your health care provider to create a written plan for managing and treating your asthma attacks (asthma action plan). This plan includes: °· A list of your asthma triggers and how you can avoid them. °· Information on when medicines should be taken and when their dosages should be changed. °· The use of a device that measures how well your lungs are working (peak flow meter). °Monitor Your Asthma °Use your peak flow meter and record your results in a journal every day. A drop in your peak flow numbers on one or more days may indicate the start of an asthma attack. This can happen even before you start to feel symptoms. You can prevent an asthma attack from getting worse by following the steps in your asthma action plan. °Avoid Asthma Triggers °Work with your asthma health care provider to find out what your asthma triggers are. This can be done by: °· Allergy testing. °· Keeping a journal that notes when asthma attacks occur and the factors that may have contributed to them. °· Determining if there are other medical conditions that are making your asthma worse. °Once you have determined your asthma triggers, take steps to avoid them. This may include avoiding excessive or prolonged exposure to: °· Dust. Have someone dust and vacuum your home for you once or  twice a week. Using a high-efficiency particulate arrestance (HEPA) vacuum is best. °· Smoke. This includes campfire smoke, forest fire smoke, and secondhand smoke from tobacco products. °· Pet dander. Avoid contact with animals that you know you are allergic to. °· Allergens from trees, grasses or pollens. Avoid spending a lot of time outdoors when pollen counts are high, and on very windy days. °· Very cold, dry, or humid air. °· Mold. °· Foods that contain high amounts of sulfites. °· Strong odors. °· Outdoor air pollutants, such as engine exhaust. °· Indoor air pollutants, such as aerosol sprays and fumes from household cleaners. °· Household pests, including dust mites and cockroaches, and pest droppings. °· Certain medicines, including NSAIDs. Always talk to your health care provider before stopping or starting any new medicines. °Medicines °Take over-the-counter and prescription medicines only as told by your health care provider. Many asthma attacks can be prevented by carefully following your medicine schedule. Taking your medicines correctly is especially important when you cannot avoid certain asthma triggers. °Act Quickly °If an asthma attack does happen, acting quickly can decrease how severe it is and how long it lasts. Take these steps:  °· Pay attention to your symptoms. If you are coughing, wheezing, or having difficulty breathing, do not wait to see if your symptoms go away on their own. Follow your asthma action plan. °· If you have followed your asthma action plan and your symptoms are not improving, call your health care provider or seek immediate medical care   at the nearest hospital. °It is important to note how often you need to use your fast-acting rescue inhaler. If you are using your rescue inhaler more often, it may mean that your asthma is not under control. Adjusting your asthma treatment plan may help you to prevent future asthma attacks and help you to gain better control of your  condition. °HOW CAN I PREVENT AN ASTHMA ATTACK WHEN I EXERCISE? °Follow advice from your health care provider about whether you should use your fast-acting inhaler before exercising. Many people with asthma experience exercise-induced bronchoconstriction (EIB). This condition often worsens during vigorous exercise in cold, humid, or dry environments. Usually, people with EIB can stay very active by pre-treating with a fast-acting inhaler before exercising. °  °This information is not intended to replace advice given to you by your health care provider. Make sure you discuss any questions you have with your health care provider. °  °Document Released: 08/31/2009 Document Revised: 06/03/2015 Document Reviewed: 02/12/2015 °Elsevier Interactive Patient Education ©2016 [employer] ° °Asthma, Acute Bronchospasm °Acute bronchospasm caused by asthma is also referred to as an asthma attack. Bronchospasm means your air passages become narrowed. The narrowing is caused by inflammation and tightening of the muscles in the air tubes (bronchi) in your lungs. This can make it hard to breathe or cause you to wheeze and cough. °CAUSES °Possible triggers are: °· Animal dander from the skin, hair, or feathers of animals. °· Dust mites contained in house dust. °· Cockroaches. °· Pollen from trees or grass. °· Mold. °· Cigarette or tobacco smoke. °· Air pollutants such as dust, household cleaners, hair sprays, aerosol sprays, paint fumes, strong chemicals, or strong odors. °· Cold air or weather changes. Cold air may trigger inflammation. Winds increase molds and pollens in the air. °· Strong emotions such as crying or laughing hard. °· Stress. °· Certain medicines such as aspirin or beta-blockers. °· Sulfites in foods and drinks, such as dried fruits and wine. °· Infections or inflammatory conditions, such as a flu, cold, or inflammation of the nasal membranes (rhinitis). °· Gastroesophageal reflux disease (GERD). GERD is a condition  where stomach acid backs up into your esophagus. °· Exercise or strenuous activity. °SIGNS AND SYMPTOMS  °· Wheezing. °· Excessive coughing, particularly at night. °· Chest tightness. °· Shortness of breath. °DIAGNOSIS  °Your health care provider will ask you about your medical history and perform a physical exam. A chest X-ray or blood testing may be performed to look for other causes of your symptoms or other conditions that may have triggered your asthma attack.  °TREATMENT  °Treatment is aimed at reducing inflammation and opening up the airways in your lungs.  Most asthma attacks are treated with inhaled medicines. These include quick relief or rescue medicines (such as bronchodilators) and controller medicines (such as inhaled corticosteroids). These medicines are sometimes given through an inhaler or a nebulizer. Systemic steroid medicine taken by mouth or given through an IV tube also can be used to reduce the inflammation when an attack is moderate or severe. Antibiotic medicines are only used if a bacterial infection is present.  °HOME CARE INSTRUCTIONS  °· Rest. °· Drink plenty of liquids. This helps the mucus to remain thin and be easily coughed up. Only use caffeine in moderation and do not use alcohol until you have recovered from your illness. °· Do not smoke. Avoid being exposed to secondhand smoke. °· You play a critical role in keeping yourself in good health. Avoid exposure to things that   cause you to wheeze or to have breathing problems.  Keep your medicines up-to-date and available. Carefully follow your health care provider's treatment plan.  Take your medicine exactly as prescribed.  When pollen or pollution is bad, keep windows closed and use an air conditioner or go to places with air conditioning.  Asthma requires careful medical care. See your health care provider for a follow-up as advised. If you are more than [redacted] weeks pregnant and you were prescribed any new medicines, let your  obstetrician know about the visit and how you are doing. Follow up with your health care provider as directed.  After you have recovered from your asthma attack, make an appointment with your outpatient doctor to talk about ways to reduce the likelihood of future attacks. If you do not have a doctor who manages your asthma, make an appointment with a primary care doctor to discuss your asthma. SEEK IMMEDIATE MEDICAL CARE IF:   You are getting worse.  You have trouble breathing. If severe, call your local emergency services (911 in the [place]  You develop chest pain or discomfort.  You are vomiting.  You are not able to keep fluids down.  You are coughing up yellow, green, brown, or bloody sputum.  You have a fever and your symptoms suddenly get worse.  You have trouble swallowing. MAKE SURE YOU:   Understand these instructions.  Will watch your condition.  Will get help right away if you are not doing well or get worse.   This information is not intended to replace advice given to you by your health care provider. Make sure you discuss any questions you have with your health care provider.   Document Released: 12/28/2006 Document Revised: 09/17/2013 Document Reviewed: 03/20/2013 Elsevier Interactive Patient Education 2016 [employer]  Cough, Adult Coughing is a reflex that clears your throat and your airways. Coughing helps to heal and protect your lungs. It is normal to cough occasionally, but a cough that happens with other symptoms or lasts a long time may be a sign of a condition that needs treatment. A cough may last only 2-3 weeks (acute), or it may last longer than 8 weeks (chronic). CAUSES Coughing is commonly caused by:  Breathing in substances that irritate your lungs.  A viral or bacterial respiratory infection.  Allergies.  Asthma.  Postnasal drip.  Smoking.  Acid backing up from the stomach into the esophagus (gastroesophageal reflux).  Certain  medicines.  Chronic lung problems, including COPD (or rarely, lung cancer).  Other medical conditions such as heart failure. HOME CARE INSTRUCTIONS  Pay attention to any changes in your symptoms. Take these actions to help with your discomfort:  Take medicines only as told by your health care provider.  If you were prescribed an antibiotic medicine, take it as told by your health care provider. Do not stop taking the antibiotic even if you start to feel better.  Talk with your health care provider before you take a cough suppressant medicine.  Drink enough fluid to keep your urine clear or pale yellow.  If the air is dry, use a cold steam vaporizer or humidifier in your bedroom or your home to help loosen secretions.  Avoid anything that causes you to cough at work or at home.  If your cough is worse at night, try sleeping in a semi-upright position.  Avoid cigarette smoke. If you smoke, quit smoking. If you need help quitting, ask your health care provider.  Avoid caffeine.  Avoid  alcohol.  Rest as needed. SEEK MEDICAL CARE IF:   You have new symptoms.  You cough up pus.  Your cough does not get better after 2-3 weeks, or your cough gets worse.  You cannot control your cough with suppressant medicines and you are losing sleep.  You develop pain that is getting worse or pain that is not controlled with pain medicines.  You have a fever.  You have unexplained weight loss.  You have night sweats. SEEK IMMEDIATE MEDICAL CARE IF:  You cough up blood.  You have difficulty breathing.  Your heartbeat is very fast.   This information is not intended to replace advice given to you by your health care provider. Make sure you discuss any questions you have with your health care provider.   Document Released: 03/11/2011 Document Revised: 06/03/2015 Document Reviewed: 11/19/2014 Elsevier Interactive Patient Education 2016 [employer].  How to Use an Inhaler Using your  inhaler correctly is very important. Good technique will make sure that the medicine reaches your lungs.  HOW TO USE AN INHALER:  Take the cap off the inhaler.  If this is the first time using your inhaler, you need to prime it. Shake the inhaler for 5 seconds. Release four puffs into the air, away from your face. Ask your doctor for help if you have questions.  Shake the inhaler for 5 seconds.  Turn the inhaler so the bottle is above the mouthpiece.  Put your pointer finger on top of the bottle. Your thumb holds the bottom of the inhaler.  Open your mouth.  Either hold the inhaler away from your mouth (the width of 2 fingers) or place your lips tightly around the mouthpiece. Ask your doctor which way to use your inhaler.  Breathe out as much air as possible.  Breathe in and push down on the bottle 1 time to release the medicine. You will feel the medicine go in your mouth and throat.  Continue to take a deep breath in very slowly. Try to fill your lungs.  After you have breathed in completely, hold your breath for 10 seconds. This will help the medicine to settle in your lungs. If you cannot hold your breath for 10 seconds, hold it for as long as you can before you breathe out.  Breathe out slowly, through pursed lips. Whistling is an example of pursed lips.  If your doctor has told you to take more than 1 puff, wait at least 15-30 seconds between puffs. This will help you get the best results from your medicine. Do not use the inhaler more than your doctor tells you to.  Put the cap back on the inhaler.  Follow the directions from your doctor or from the inhaler package about cleaning the inhaler. If you use more than one inhaler, ask your doctor which inhalers to use and what order to use them in. Ask your doctor to help you figure out when you will need to refill your inhaler.  If you use a steroid inhaler, always rinse your mouth with water after your last puff, gargle and spit  out the water. Do not swallow the water. GET HELP IF:  The inhaler medicine only partially helps to stop wheezing or shortness of breath.  You are having trouble using your inhaler.  You have some increase in thick spit (phlegm). GET HELP RIGHT AWAY IF:  The inhaler medicine does not help your wheezing or shortness of breath or you have tightness in your chest.  You have dizziness, headaches, or fast heart rate.  You have chills, fever, or night sweats.  You have a large increase of thick spit, or your thick spit is bloody. MAKE SURE YOU:   Understand these instructions.  Will watch your condition.  Will get help right away if you are not doing well or get worse.   This information is not intended to replace advice given to you by your health care provider. Make sure you discuss any questions you have with your health care provider.   Document Released: 06/21/2008 Document Revised: 07/03/2013 Document Reviewed: 04/11/2013 Elsevier Interactive Patient Education 2016 [employer]  Upper Respiratory Infection, Adult Recommend taking either Zyrtec, Claritin or Allegra on a daily basis for drainage. Start using her albuterol inhaler 2 puffs every 4 hours as needed for coughing and wheezing Prednisone taper dose as directed Drink plenty of fluids and stay well-hydrated. Most upper respiratory infections (URIs) are caused by a virus. A URI affects the nose, throat, and upper air passages. The most common type of URI is often called "the common cold." HOME CARE   Take medicines only as told by your doctor.  Gargle warm saltwater or take cough drops to comfort your throat as told by your doctor.  Use a warm mist humidifier or inhale steam from a shower to increase air moisture. This may make it easier to breathe.  Drink enough fluid to keep your pee (urine) clear or pale yellow.  Eat soups and other clear broths.  Have a healthy diet.  Rest as needed.  Go back to work  when your fever is gone or your doctor says it is okay.  You may need to stay home longer to avoid giving your URI to others.  You can also wear a face mask and wash your hands often to prevent spread of the virus.  Use your inhaler more if you have asthma.  Do not use any tobacco products, including cigarettes, chewing tobacco, or electronic cigarettes. If you need help quitting, ask your doctor. GET HELP IF:  You are getting worse, not better.  Your symptoms are not helped by medicine.  You have chills.  You are getting more short of breath.  You have brown or red mucus.  You have yellow or brown discharge from your nose.  You have pain in your face, especially when you bend forward.  You have a fever.  You have puffy (swollen) neck glands.  You have pain while swallowing.  You have white areas in the back of your throat. GET HELP RIGHT AWAY IF:   You have very bad or constant:  Headache.  Ear pain.  Pain in your forehead, behind your eyes, and over your cheekbones (sinus pain).  Chest pain.  You have long-lasting (chronic) lung disease and any of the following:  Wheezing.  Long-lasting cough.  Coughing up blood.  A change in your usual mucus.  You have a stiff neck.  You have changes in your:  Vision.  Hearing.  Thinking.  Mood. MAKE SURE YOU:   Understand these instructions.  Will watch your condition.  Will get help right away if you are not doing well or get worse.   This information is not intended to replace advice given to you by your health care provider. Make sure you discuss any questions you have with your health care provider.   Document Released: 02/29/2008 Document Revised: 01/27/2015 Document Reviewed: 12/18/2013 Elsevier Interactive Patient Education [employer]

## 2015-11-13 NOTE — ED Provider Notes (Signed)
CSN: [id number]     Arrival date & time 11/13/15  1504 History   First MD Initiated Contact with Patient 11/13/15 1644     Chief Complaint  Patient presents with  . URI   (Consider location/radiation/quality/duration/timing/severity/associated sxs/prior Treatment) HPI Comments: 25 year old obese female complaining of cough and runny nose for 2-3 days. She states that when she takes a deep breath it forces her to cough. She has a history of asthma but she is not using her albuterol regularly. Denies fevers.   Past Medical History  Diagnosis Date  . Seizure (HCC)   . GERD (gastroesophageal reflux disease)    Past Surgical History  Procedure Laterality Date  . Knee arthroscopy w/ acl reconstruction  2004    L knee   Family History  Problem Relation Age of Onset  . Diabetes Mother   . Diabetes Father    Social History  Substance Use Topics  . Smoking status: Never Smoker   . Smokeless tobacco: None  . Alcohol Use: No   OB History    No data available     Review of Systems  Constitutional: Positive for activity change. Negative for fever, chills, appetite change and fatigue.  HENT: Positive for congestion, postnasal drip, rhinorrhea and sore throat. Negative for facial swelling.   Eyes: Negative.   Respiratory: Positive for cough.   Cardiovascular: Negative.   Musculoskeletal: Negative.  Negative for neck pain and neck stiffness.  Skin: Negative for pallor and rash.  Neurological: Negative.     Allergies  Codeine  Home Medications   Prior to Admission medications   Medication Sig Start Date End Date Taking? Authorizing Provider  topiramate (TOPAMAX) 100 MG tablet Take 100 mg by mouth 2 (two) times daily.   Yes Historical Provider, MD  ibuprofen (ADVIL,MOTRIN) 200 MG tablet Take 400 mg by mouth every 6 (six) hours as needed for moderate pain.    Historical Provider, MD  ibuprofen (ADVIL,MOTRIN) 800 MG tablet Take 1 tablet (800 mg total) by mouth 3 (three) times  daily. Patient not taking: Reported on 07/16/2015 08/18/14   Elpidio Anis, PA-C  omeprazole (PRILOSEC) 40 MG capsule Take 40 mg by mouth daily.    Historical Provider, MD  ondansetron (ZOFRAN) 4 MG tablet Take 1 tablet (4 mg total) by mouth every 6 (six) hours. 07/16/15   Elpidio Anis, PA-C  oxyCODONE-acetaminophen (ROXICET) 5-325 MG tablet Take 1 tablet by mouth every 4 (four) hours as needed for severe pain. 07/16/15   Elpidio Anis, PA-C  predniSONE (DELTASONE) 20 MG tablet Take 3 tabs po on first day, 2 tabs second day, 2 tabs third day, 1 tab fourth day, 1 tab 5th day. Take with food. 11/13/15   Hayden Rasmussen, NP   Meds Ordered and Administered this Visit   Medications  ipratropium-albuterol (DUONEB) 0.5-2.5 (3) MG/3ML nebulizer solution 3 mL (3 mLs Nebulization Given 11/13/15 1731)    BP 134/89 mmHg  Pulse 89  Temp(Src) 98.2 F (36.8 C) (Oral)  Resp 16  SpO2 99%  LMP 10/21/2015 No data found.   Physical Exam  Constitutional: She appears well-developed and well-nourished. No distress.  HENT:  Bilateral TMs are normal Oropharynx with minor erythema and mild to moderate amount of clear PND.  Eyes: EOM are normal.  Neck: Normal range of motion. Neck supple.  Cardiovascular: Normal rate, regular rhythm and normal heart sounds.   Pulmonary/Chest: Effort normal. She has wheezes. She has no rales.  Expiratory wheeze bilaterally. Unable to take a deep breath  without coughing. No crackles.  Lymphadenopathy:    She has no cervical adenopathy.  Neurological: She is alert. No cranial nerve deficit. She exhibits normal muscle tone.  Skin: Skin is warm and dry.  Psychiatric: She has a normal mood and affect.  Nursing note and vitals reviewed.   ED Course  Procedures (including critical care time)  Labs Review Labs Reviewed - No data to display  Imaging Review No results found.   Visual Acuity Review  Right Eye Distance:   Left Eye Distance:   Bilateral Distance:    Right  Eye Near:   Left Eye Near:    Bilateral Near:         MDM   1. URI (upper respiratory infection)   2. Asthma exacerbation attacks, mild persistent     duoneb 2.5/5. Post DuoNeb the patient states she is breathing better. She has much improved air movement, improved chest expansion, no wheezes are heard. Recommend taking either Zyrtec, Claritin or Allegra on a daily basis for drainage. Start using her albuterol inhaler 2 puffs every 4 hours as needed for coughing and wheezing Prednisone taper dose as directed Drink plenty of fluids and stay well-hydrated.      Hayden Rasmussen, NP 11/13/15 1753

## 2015-11-17 ENCOUNTER — Telehealth (HOSPITAL_COMMUNITY): Payer: Self-pay | Admitting: Emergency Medicine

## 2015-11-17 NOTE — ED Notes (Signed)
Returned [employer]... States that in their system, it shows pt being allergic to corticoid steroids Per Dr. Artis Flock... Ok to fill.

## 2016-03-11 ENCOUNTER — Encounter (HOSPITAL_COMMUNITY): Payer: Self-pay | Admitting: Emergency Medicine

## 2016-03-11 ENCOUNTER — Emergency Department (HOSPITAL_COMMUNITY)
Admission: EM | Admit: 2016-03-11 | Discharge: 2016-03-11 | Disposition: A | Discharge status: Home / Self Care | Payer: Medicaid Other | Attending: Emergency Medicine | Admitting: Emergency Medicine

## 2016-03-11 ENCOUNTER — Emergency Department (HOSPITAL_COMMUNITY): Payer: Medicaid Other

## 2016-03-11 DIAGNOSIS — J45909 Unspecified asthma, uncomplicated: Secondary | ICD-10-CM | POA: Diagnosis not present

## 2016-03-11 DIAGNOSIS — M79671 Pain in right foot: Secondary | ICD-10-CM | POA: Diagnosis present

## 2016-03-11 DIAGNOSIS — Y9389 Activity, other specified: Secondary | ICD-10-CM | POA: Diagnosis not present

## 2016-03-11 DIAGNOSIS — Y999 Unspecified external cause status: Secondary | ICD-10-CM | POA: Insufficient documentation

## 2016-03-11 DIAGNOSIS — W109XXA Fall (on) (from) unspecified stairs and steps, initial encounter: Secondary | ICD-10-CM | POA: Diagnosis not present

## 2016-03-11 DIAGNOSIS — S93401A Sprain of unspecified ligament of right ankle, initial encounter: Secondary | ICD-10-CM | POA: Insufficient documentation

## 2016-03-11 DIAGNOSIS — Y929 Unspecified place or not applicable: Secondary | ICD-10-CM | POA: Insufficient documentation

## 2016-03-11 HISTORY — DX: Unspecified asthma, uncomplicated: J45.909

## 2016-03-11 MED ORDER — NAPROXEN 500 MG PO TABS: 500.0000 mg | ORAL_TABLET | Freq: Two times a day (BID) | ORAL | Status: DC

## 2016-03-11 NOTE — ED Provider Notes (Signed)
CSN: [id number]     Arrival date & time 03/11/16  1703 History  By signing my name below, I, Brenda Vargas, attest that this documentation has been prepared under the direction and in the presence of [employer], PA-C. Electronically Signed: Evon Vargas, ED Scribe. 03/11/2016. 6:00 PM.      Chief Complaint  Patient presents with  . Foot Pain    The history is provided by the patient. No language interpreter was used.   HPI Comments: Brenda Vargas is a 25 y.o. female who presents to the Emergency Department complaining of right foot pain onset today status post fall. She states she has associated swelling. Pt states that she was rushing the down the steps and mis stepped on the last step. Pt states that she rolled the ankle. Pt is ambulating with a cane. Pt states she has tried ibuprofen with no relief. Pt has also ace wrapped the ankle PTA with no relief. Pt denies numbness or tingling, Knee pain.   Past Medical History  Diagnosis Date  . Seizure (HCC)   . GERD (gastroesophageal reflux disease)   . Asthma    Past Surgical History  Procedure Laterality Date  . Knee arthroscopy w/ acl reconstruction  2004    L knee   Family History  Problem Relation Age of Onset  . Diabetes Mother   . Diabetes Father    Social History  Substance Use Topics  . Smoking status: Never Smoker   . Smokeless tobacco: None  . Alcohol Use: No   OB History    No data available     Review of Systems  Musculoskeletal: Positive for arthralgias.   A complete 10 system review of systems was obtained and all systems are negative except as noted in the HPI and PMH.    Allergies  Codeine  Home Medications   Prior to Admission medications   Medication Sig Start Date End Date Taking? Authorizing Provider  ibuprofen (ADVIL,MOTRIN) 200 MG tablet Take 400 mg by mouth every 6 (six) hours as needed for moderate pain.    Historical Provider, MD  ibuprofen (ADVIL,MOTRIN) 800 MG tablet Take 1  tablet (800 mg total) by mouth 3 (three) times daily. Patient not taking: Reported on 07/16/2015 08/18/14   Elpidio Anis, PA-C  naproxen (NAPROSYN) 500 MG tablet Take 1 tablet (500 mg total) by mouth 2 (two) times daily. 03/11/16   Joycie Peek, PA-C  omeprazole (PRILOSEC) 40 MG capsule Take 40 mg by mouth daily.    Historical Provider, MD  ondansetron (ZOFRAN) 4 MG tablet Take 1 tablet (4 mg total) by mouth every 6 (six) hours. 07/16/15   Elpidio Anis, PA-C  oxyCODONE-acetaminophen (ROXICET) 5-325 MG tablet Take 1 tablet by mouth every 4 (four) hours as needed for severe pain. 07/16/15   Elpidio Anis, PA-C  predniSONE (DELTASONE) 20 MG tablet Take 3 tabs po on first day, 2 tabs second day, 2 tabs third day, 1 tab fourth day, 1 tab 5th day. Take with food. 11/13/15   Hayden Rasmussen, NP  topiramate (TOPAMAX) 100 MG tablet Take 100 mg by mouth 2 (two) times daily.    Historical Provider, MD   BP 117/78 mmHg  Pulse 93  Temp(Src) 98.2 F (36.8 C) (Oral)  Resp 16  SpO2 99%  LMP 02/18/2016   Physical Exam  Constitutional: She is oriented to person, place, and time. She appears well-developed and well-nourished. No distress.  HENT:  Head: Normocephalic and atraumatic.  Eyes: Conjunctivae and EOM  are normal.  Neck: Neck supple. No tracheal deviation present.  Cardiovascular: Normal rate.   Pulmonary/Chest: Effort normal. No respiratory distress.  Musculoskeletal: Normal range of motion. She exhibits tenderness.  Mild TTP to superior aspect of right foot 4th and 5th metatarsal no tenderness to medial or lateral malleolus distal sensation intact, sensation intact, brisk cap refill. ild swelling in right ankle. Full ROM of right knee with no tenderness.   Neurological: She is alert and oriented to person, place, and time.  Skin: Skin is warm and dry.  Psychiatric: She has a normal mood and affect. Her behavior is normal.  Nursing note and vitals reviewed.   ED Course  Procedures (including  critical care time) DIAGNOSTIC STUDIES: Oxygen Saturation is 99% on RA, normal by my interpretation.    COORDINATION OF CARE: 5:58 PM-Discussed treatment plan with pt at bedside and pt agreed to plan.     Labs Review Labs Reviewed - No data to display  Imaging Review Dg Foot Complete Right  03/11/2016  CLINICAL DATA:  Pain in the arch of the foot status post twisting injury. EXAM: RIGHT FOOT COMPLETE - 3+ VIEW COMPARISON:  None. FINDINGS: There is no definite evidence of displaced fracture or dislocation. There is a few mm calcific density adjacent to the calcaneocuboid joint. Soft tissues are unremarkable. IMPRESSION: No definite evidence of displaced fracture or dislocation. Few mm calcific density adjacent to the calcaneocuboid joint which may represent accessory ossifications center or alternatively a small avulsion fracture. Donor site is not seen. Please correlate clinically for possible point tenderness. Electronically Signed   By: Ted Mcalpineobrinka  Dimitrova M.D.   On: 03/11/2016 19:02      EKG Interpretation None      MDM  Patient's symptoms consistent with ankle sprain. Physical exam is reassuring, remains neurovascularly intact. X-ray without acute fracture or dislocation. Treatment in emergency department includes ice pack, ankle brace. DC with NSAIDs, Rice therapy and follow-up with PCP. Final diagnoses:  Ankle sprain, right, initial encounter    I personally performed the services described in this documentation, which was scribed in my presence. The recorded information has been reviewed and is accurate.      Joycie PeekBenjamin Alphonsa Brickle, PA-C 03/11/16 1937  Gwyneth SproutWhitney Plunkett, MD 03/12/16 0800

## 2016-03-11 NOTE — Discharge Instructions (Signed)
Your exam was reassuring. Her x-ray was negative for any broken bones or dislocations. You may take naproxen as prescribed to help with your ankle sprain. Please use the attached resources for further symptoms support. Return to ED for new or worsening symptoms.  Ankle Sprain An ankle sprain is an injury to the strong, fibrous tissues (ligaments) that hold the bones of your ankle joint together.  CAUSES An ankle sprain is usually caused by a fall or by twisting your ankle. Ankle sprains most commonly occur when you step on the outer edge of your foot, and your ankle turns inward. People who participate in sports are more prone to these types of injuries.  SYMPTOMS   Pain in your ankle. The pain may be present at rest or only when you are trying to stand or walk.  Swelling.  Bruising. Bruising may develop immediately or within 1 to 2 days after your injury.  Difficulty standing or walking, particularly when turning corners or changing directions. DIAGNOSIS  Your caregiver will ask you details about your injury and perform a physical exam of your ankle to determine if you have an ankle sprain. During the physical exam, your caregiver will press on and apply pressure to specific areas of your foot and ankle. Your caregiver will try to move your ankle in certain ways. An X-ray exam may be done to be sure a bone was not broken or a ligament did not separate from one of the bones in your ankle (avulsion fracture).  TREATMENT  Certain types of braces can help stabilize your ankle. Your caregiver can make a recommendation for this. Your caregiver may recommend the use of medicine for pain. If your sprain is severe, your caregiver may refer you to a surgeon who helps to restore function to parts of your skeletal system (orthopedist) or a physical therapist. HOME CARE INSTRUCTIONS   Apply ice to your injury for 1-2 days or as directed by your caregiver. Applying ice helps to reduce inflammation and  pain.  Put ice in a plastic bag.  Place a towel between your skin and the bag.  Leave the ice on for 15-20 minutes at a time, every 2 hours while you are awake.  Only take over-the-counter or prescription medicines for pain, discomfort, or fever as directed by your caregiver.  Elevate your injured ankle above the level of your heart as much as possible for 2-3 days.  If your caregiver recommends crutches, use them as instructed. Gradually put weight on the affected ankle. Continue to use crutches or a cane until you can walk without feeling pain in your ankle.  If you have a plaster splint, wear the splint as directed by your caregiver. Do not rest it on anything harder than a pillow for the first 24 hours. Do not put weight on it. Do not get it wet. You may take it off to take a shower or bath.  You may have been given an elastic bandage to wear around your ankle to provide support. If the elastic bandage is too tight (you have numbness or tingling in your foot or your foot becomes cold and blue), adjust the bandage to make it comfortable.  If you have an air splint, you may blow more air into it or let air out to make it more comfortable. You may take your splint off at night and before taking a shower or bath. Wiggle your toes in the splint several times per day to decrease swelling. SEEK MEDICAL  CARE IF:   You have rapidly increasing bruising or swelling.  Your toes feel extremely cold or you lose feeling in your foot.  Your pain is not relieved with medicine. SEEK IMMEDIATE MEDICAL CARE IF:  Your toes are numb or blue.  You have severe pain that is increasing. MAKE SURE YOU:   Understand these instructions.  Will watch your condition.  Will get help right away if you are not doing well or get worse.   This information is not intended to replace advice given to you by your health care provider. Make sure you discuss any questions you have with your health care provider.    Document Released: 09/12/2005 Document Revised: 10/03/2014 Document Reviewed: 09/24/2011 Elsevier Interactive Patient Education [employer]

## 2016-03-11 NOTE — ED Notes (Signed)
Pt c/o right foot pain. Pt states she fell today and heard a crack.

## 2016-07-07 ENCOUNTER — Encounter: Payer: Self-pay | Admitting: Family Medicine

## 2016-07-07 ENCOUNTER — Ambulatory Visit (INDEPENDENT_AMBULATORY_CARE_PROVIDER_SITE_OTHER)
Admission: RE | Admit: 2016-07-07 | Discharge: 2016-07-07 | Disposition: A | Discharge status: Home / Self Care | Payer: Self-pay | Source: Ambulatory Visit | Attending: Family Medicine | Admitting: Family Medicine

## 2016-07-07 ENCOUNTER — Ambulatory Visit (INDEPENDENT_AMBULATORY_CARE_PROVIDER_SITE_OTHER): Payer: Medicaid Other | Admitting: Family Medicine

## 2016-07-07 VITALS — BP 128/80 | HR 83 | Temp 98.0°F | Resp 12 | Ht 64.0 in | Wt 236.0 lb

## 2016-07-07 DIAGNOSIS — R059 Cough, unspecified: Secondary | ICD-10-CM

## 2016-07-07 DIAGNOSIS — K580 Irritable bowel syndrome with diarrhea: Secondary | ICD-10-CM

## 2016-07-07 DIAGNOSIS — R5383 Other fatigue: Secondary | ICD-10-CM

## 2016-07-07 DIAGNOSIS — F32A Depression, unspecified: Secondary | ICD-10-CM | POA: Insufficient documentation

## 2016-07-07 DIAGNOSIS — R05 Cough: Secondary | ICD-10-CM

## 2016-07-07 DIAGNOSIS — F329 Major depressive disorder, single episode, unspecified: Secondary | ICD-10-CM | POA: Insufficient documentation

## 2016-07-07 DIAGNOSIS — Z6841 Body Mass Index (BMI) 40.0 and over, adult: Secondary | ICD-10-CM | POA: Insufficient documentation

## 2016-07-07 DIAGNOSIS — Z23 Encounter for immunization: Secondary | ICD-10-CM

## 2016-07-07 DIAGNOSIS — R0789 Other chest pain: Secondary | ICD-10-CM

## 2016-07-07 DIAGNOSIS — F418 Other specified anxiety disorders: Secondary | ICD-10-CM

## 2016-07-07 DIAGNOSIS — K219 Gastro-esophageal reflux disease without esophagitis: Secondary | ICD-10-CM

## 2016-07-07 DIAGNOSIS — J45909 Unspecified asthma, uncomplicated: Secondary | ICD-10-CM | POA: Insufficient documentation

## 2016-07-07 DIAGNOSIS — F419 Anxiety disorder, unspecified: Secondary | ICD-10-CM

## 2016-07-07 DIAGNOSIS — J4520 Mild intermittent asthma, uncomplicated: Secondary | ICD-10-CM

## 2016-07-07 LAB — CBC
HCT: 39.8 % (ref 36.0–46.0)
Hemoglobin: 13.7 g/dL (ref 12.0–15.0)
MCHC: 34.4 g/dL (ref 30.0–36.0)
MCV: 93.2 fl (ref 78.0–100.0)
Platelets: 215 10*3/uL (ref 150.0–400.0)
RBC: 4.27 Mil/uL (ref 3.87–5.11)
RDW: 12.9 % (ref 11.5–15.5)
WBC: 5.2 10*3/uL (ref 4.0–10.5)

## 2016-07-07 LAB — COMPREHENSIVE METABOLIC PANEL
ALT: 28 U/L (ref 0–35)
AST: 18 U/L (ref 0–37)
Albumin: 4 g/dL (ref 3.5–5.2)
Alkaline Phosphatase: 71 U/L (ref 39–117)
BUN: 11 mg/dL (ref 6–23)
CO2: 25 mEq/L (ref 19–32)
Calcium: 9.2 mg/dL (ref 8.4–10.5)
Chloride: 110 mEq/L (ref 96–112)
Creatinine, Ser: 0.73 mg/dL (ref 0.40–1.20)
GFR: 102.17 mL/min (ref >60.00)
Glucose, Bld: 104 mg/dL — ABNORMAL HIGH (ref 70–99)
Potassium: 3.9 mEq/L (ref 3.5–5.1)
Sodium: 140 mEq/L (ref 135–145)
Total Bilirubin: 0.3 mg/dL (ref 0.2–1.2)
Total Protein: 6.4 g/dL (ref 6.0–8.3)

## 2016-07-07 LAB — C-REACTIVE PROTEIN: CRP: 0.2 mg/dL — AB (ref 0.5–20.0)

## 2016-07-07 LAB — TSH: TSH: 0.43 u[IU]/mL (ref 0.35–4.50)

## 2016-07-07 LAB — HEMOGLOBIN A1C: Hgb A1c MFr Bld: 5 % (ref 4.6–6.5)

## 2016-07-07 NOTE — Patient Instructions (Addendum)
A few things to remember from today's visit:   Cough - Plan: DG Chest 2 View  Anxiety and depression  Mild intermittent extrinsic asthma without complication  Gastroesophageal reflux disease, esophagitis presence not specified  Chest wall pain - Plan: DG Chest 2 View  BMI 40.0-44.9, adult (HCC)  Irritable bowel syndrome with diarrhea   Avoid foods that make your symptoms worse, for example coffee, chocolate,pepermeint,alcohol, and greasy food. Raising the head of your bed about 6 inches may help with nocturnal symptoms.   Weight loss (if you are overweight). Avoid lying down for 3 hours after eating.  Instead 3 large meals daily try small and more frequent meals during the day.  Start Omeprazole   You should be evaluated immediately if bloody vomiting, bloody stools, black stools (like tar), difficulty swallowing, food gets stuck on the way down or choking when eating. Abnormal weight loss or severe abdominal pain.  Cough can be caused by allergies, asthma, or recent cold. Try albuterol 2 puffs every 6 hours for a week and then as needed, if cough persist we may need another inhaler.  Over-the-counter Zyrtec 10 mg or Allegra 180 mg.  If symptoms are not resolved sometimes endoscopy is necessary.   What are some tips for weight loss? People become overweight for many reasons. Weight issues can run in families. They can be caused by unhealthy behaviors and a person's environment. Certain health problems and medicines can also lead to weight gain. There are some simple things you can do to reach and maintain a healthy weight:  Eat small more frequent healthy meals instead 3 bid meals. Also Weight Watchers is a good option. Avoid sweet drinks. These include regular soft drinks, fruit juices, fruit drinks, energy drinks, sweetened iced tea, and flavored milk. Avoid fast foods. Fast foods such as french fries, hamburgers, chicken nuggets, and pizza are high in calories and can  cause weight gain. Eat a healthy breakfast. People who skip breakfast tend to weigh more. Don't watch more than two hours of television per day. Chew sugar-free gum between meals to cut down on snacking. Avoid grocery shopping when you're hungry. Pack a healthy lunch instead of eating out to control what and how much you eat. Eat a lot of fruits and vegetables. Aim for about 2 cups of fruit and 2 to 3 cups of vegetables per day. Aim for 150 minutes per week of moderate-intensity exercise (such as brisk walking), or 75 minutes per week of vigorous exercise (such as jogging or running). OR 15-30 min of daily brisk walking. Be more active. Small changes in physical activity can easily be added to your daily routine. For example, take the stairs instead of the elevator. Take a walk with your family. A daily walk is a great way to get exercise and to catch up on the day's events.  Avoid foods that cause diarrhea. Please schedule an appointment with the psychiatrist.    Please be sure medication list is accurate. If a new problem present, please set up appointment sooner than planned today.

## 2016-07-07 NOTE — Progress Notes (Signed)
HPI:   [name] is a 25 y.o. female, who is here today with her father and grandmother to establish care with me.  Former PCP: [facility]. Last routine physical in 02/2011, gynecologic preventive care.   She follows with Dr Hyacinth Meeker, neurologists, due to seizure disorder and migraine headache.  She follows every 6 months, states that has not had seizures in a while. She is currently on Topamax 100 mg bid.  Also Hx of anxiety and depression, anger issues reported by her father. She is on Fluoxetine, which she has taken for about 9 months. She followed with psychiatrists in 02/2015 and she is supposed to follow q 4 months.   She lives with grandmother.    Concerns today: sleepiness and diarrhea.  According to father, for about 4 years she has had fatigue, sleeps all day, she does not want to get up and does not help with chores around her house. She sleeps well at night. Hx of depression. No known sleep apnea.   - Also for years she has had intermittent episodes of abdominal cramps, diarrhea, nausea, and vomiting. Usually exacerbated by certain food, cheese in particular. Father is concerned that these symptoms could be celiac disease. She eats bread,pizza,and pasta with no problem. It happens about twice per month.   Symptoms are alleviated by vomiting and/or defecation. She has not noted blood in stools or melena. Last episode was about 2 weeks ago.    - Cough: She is recovering from a URI, residual cough for about 2 weeks. She has history of asthma, she denies any wheezing or dyspnea. She has not noted fever, chills, or myalgias.  History of allergic rhinitis, she takes OTC "antiallergic medication."  She also has history of GERD, she has not started Omeprazole. + Heartburn.    - Right chest pain for the past 5-6 months, no history of trauma, aggravated lately for the cough, it keeps her from sleeping at night because it is  exacerbated by certain positions. Pain is "bad", intermittent, no radiated. It is alleviated by shallow breathing.  - Father is very concerned about her wt. He has Hx of DM II on insulin and so is her mother. According to father, she doesn't follow a healthy diet, snacks all day. Her father lists foods she normally eats: pizza, bread, pasta, Gatorade, tacos, lunchables. She does not exercise regularly.    Review of Systems  Constitutional: Positive for fatigue. Negative for activity change, appetite change, fever and unexpected weight change.  HENT: Positive for congestion, postnasal drip and rhinorrhea. Negative for mouth sores, nosebleeds, sore throat and trouble swallowing.   Eyes: Negative for redness and visual disturbance.  Respiratory: Positive for cough. Negative for shortness of breath and wheezing.   Cardiovascular: Positive for chest pain. Negative for palpitations and leg swelling.  Gastrointestinal: Negative for abdominal pain, nausea and vomiting.       Negative for changes in bowel habits.  Endocrine: Negative for cold intolerance, heat intolerance, polydipsia, polyphagia and polyuria.  Genitourinary: Negative for decreased urine volume, difficulty urinating, dysuria and hematuria.  Musculoskeletal: Negative for gait problem and myalgias.  Skin: Negative for color change and rash.  Neurological: Negative for seizures, syncope, weakness, numbness and headaches.  Psychiatric/Behavioral: Positive for sleep disturbance. Negative for confusion and suicidal ideas. The patient is nervous/anxious.       Current Outpatient Prescriptions on File Prior to Visit  Medication Sig Dispense Refill  . omeprazole (PRILOSEC) 40  MG capsule Take 40 mg by mouth daily.    Marland Kitchen topiramate (TOPAMAX) 100 MG tablet Take 100 mg by mouth 2 (two) times daily.    . [DISCONTINUED] albuterol (PROVENTIL HFA;VENTOLIN HFA) 108 (90 BASE) MCG/ACT inhaler Inhale 1-2 puffs into the lungs every 6 (six) hours as  needed for wheezing or shortness of breath. 1 Inhaler 3  . [DISCONTINUED] fexofenadine (ALLEGRA) 180 MG tablet Take 1 tablet (180 mg total) by mouth daily. (Patient not taking: Reported on 08/18/2014) 14 tablet 0  . [DISCONTINUED] ipratropium (ATROVENT) 0.06 % nasal spray Place 2 sprays into both nostrils 4 (four) times daily. (Patient not taking: Reported on 07/16/2015) 15 mL 12   No current facility-administered medications on file prior to visit.      Past Medical History:  Diagnosis Date  . Allergy   . Anxiety   . Asthma   . Chronic headaches    . Migraine, unspecified, without mention of intractable migraine without mention of status migrainosus   . Depression   . GERD (gastroesophageal reflux disease)   . Seizure (HCC)    . Generalized convulsive epilepsy without mention of intractable epilepsy    Allergies  Allergen Reactions  . Codeine Rash    Family History  Problem Relation Age of Onset  . Diabetes Mother   . Diabetes Father   . Hypertension Father   . Diabetes Paternal Grandmother   . Heart disease Maternal Grandmother   . Stroke Maternal Grandmother     Social History   Social History  . Marital status: Single    Spouse name: N/A  . Number of children: N/A  . Years of education: N/A   Social History Main Topics  . Smoking status: Former Games developer  . Smokeless tobacco: Never Used  . Alcohol use No  . Drug use: No  . Sexual activity: Not Currently    Birth control/ protection: None   Other Topics Concern  . None   Social History Narrative  . None    Vitals:   07/07/16 1357  BP: 128/80  Pulse: 83  Resp: 12  Temp: 98 F (36.7 C)   O2 sat at RA 98%  Body mass index is 40.51 kg/m.      Physical Exam  Nursing note and vitals reviewed. Constitutional: She is oriented to person, place, and time. She appears well-developed. No distress.  HENT:  Head: Atraumatic.  Mouth/Throat: Oropharynx is clear and moist and mucous membranes are normal.   Eyes: Conjunctivae and EOM are normal. Pupils are equal, round, and reactive to light.  Neck: No thyroid mass and no thyromegaly present.  Cardiovascular: Normal rate and regular rhythm.   No murmur heard. Pulses:      Dorsalis pedis pulses are 2+ on the right side, and 2+ on the left side.  Respiratory: Effort normal and breath sounds normal. No respiratory distress. She exhibits tenderness (Right costochondran joints).  GI: Soft. She exhibits no mass. There is no hepatomegaly. There is no tenderness.  Musculoskeletal: She exhibits no edema.  Lymphadenopathy:    She has no cervical adenopathy.  Neurological: She is alert and oriented to person, place, and time. She has normal strength. Coordination and gait normal.  Skin: Skin is warm. No erythema.  Psychiatric: She has a normal mood and affect. She is slowed.  Well groomed, good eye contact.      ASSESSMENT AND PLAN:    Brenda Vargas was seen today for establish care.  Diagnoses and all orders for this  visit:    Lab Results  Component Value Date   TSH 0.43 07/07/2016   Lab Results  Component Value Date   WBC 5.2 07/07/2016   HGB 13.7 07/07/2016   HCT 39.8 07/07/2016   MCV 93.2 07/07/2016   PLT 215.0 07/07/2016     Chemistry      Component Value Date/Time   NA 140 07/07/2016 1456   K 3.9 07/07/2016 1456   CL 110 07/07/2016 1456   CO2 25 07/07/2016 1456   BUN 11 07/07/2016 1456   CREATININE 0.73 07/07/2016 1456      Component Value Date/Time   CALCIUM 9.2 07/07/2016 1456   ALKPHOS 71 07/07/2016 1456   AST 18 07/07/2016 1456   ALT 28 07/07/2016 1456   BILITOT 0.3 07/07/2016 1456     Lab Results  Component Value Date   HGBA1C 5.0 07/07/2016   Lab Results  Component Value Date   CRP 0.2 (L) 07/07/2016     Other fatigue   We discussed possible causes, including psychiatric and systemic illness. Some labs ordered today, further recommendations will be given accordingly. Healthful diet and wt loss may  help.  -     TSH -     C-reactive protein -     Comprehensive metabolic panel -     CBC  Mild intermittent extrinsic asthma without complication  Cough could be related with asthma. Recommend Albuterol inh 2 puff qid for a week. Instructed about warning signs. If cough persists we may consider adding another inhaler.  Cough  We discussed possible etiologies: s/p URI,allergies, asthma, GERD among some. Albuterol may help. Zyrtec 10 mg daily OTC.   -     DG Chest 2 View; Future  Anxiety and depression  No changes in Fluoxetine. Strongly recommended re-scheduling her appt with psychiatrists.   Gastroesophageal reflux disease, esophagitis presence not specified  GERD precautions discussed. Omeprazole 40 mg , which she has already, recommend trying for 6-8 weeks. Wt loss.  Chest wall pain  Seems costochondritis. Because persistent since 12/2015 CXR ordered. Will continue monitoring. F/U in 3 months.  -     DG Chest 2 View; Future  BMI 40.0-44.9, adult (HCC)  We discussed benefits of wt loss as well as adverse effects of obesity. Consistency with healthy diet and physical activity recommended. F/U in 3 months.  -     HgB A1c  Irritable bowel syndrome with diarrhea  Symptoms suggest IBS. Hx does not suggest celiac disease. Other possible causes discussed. Avoid trigger factors she has already identified.   -     C-reactive protein  Need for immunization against influenza -     Flu Vaccine QUAD 36+ mos IM          Takyra Cantrall G. Swaz[place], MD  [facility]. [place] office.

## 2016-07-07 NOTE — Progress Notes (Signed)
Pre visit review using our clinic review tool, if applicable. No additional management support is needed unless otherwise documented below in the visit note. 

## 2016-10-07 ENCOUNTER — Encounter: Payer: Self-pay | Admitting: Family Medicine

## 2016-10-07 ENCOUNTER — Ambulatory Visit (INDEPENDENT_AMBULATORY_CARE_PROVIDER_SITE_OTHER): Payer: Medicare HMO | Admitting: Family Medicine

## 2016-10-07 VITALS — BP 124/86 | HR 88 | Temp 98.2°F | Resp 12 | Ht 64.0 in | Wt 234.5 lb

## 2016-10-07 DIAGNOSIS — K219 Gastro-esophageal reflux disease without esophagitis: Secondary | ICD-10-CM | POA: Diagnosis not present

## 2016-10-07 DIAGNOSIS — F419 Anxiety disorder, unspecified: Secondary | ICD-10-CM

## 2016-10-07 DIAGNOSIS — M79672 Pain in left foot: Secondary | ICD-10-CM

## 2016-10-07 DIAGNOSIS — R0789 Other chest pain: Secondary | ICD-10-CM

## 2016-10-07 DIAGNOSIS — R5382 Chronic fatigue, unspecified: Secondary | ICD-10-CM

## 2016-10-07 DIAGNOSIS — F418 Other specified anxiety disorders: Secondary | ICD-10-CM

## 2016-10-07 DIAGNOSIS — J309 Allergic rhinitis, unspecified: Secondary | ICD-10-CM

## 2016-10-07 DIAGNOSIS — J4522 Mild intermittent asthma with status asthmaticus: Secondary | ICD-10-CM

## 2016-10-07 DIAGNOSIS — F329 Major depressive disorder, single episode, unspecified: Secondary | ICD-10-CM

## 2016-10-07 MED ORDER — MELOXICAM 7.5 MG PO TABS: 7.5000 mg | ORAL_TABLET | Freq: Every day | ORAL | 0 refills | Status: AC | PRN

## 2016-10-07 MED ORDER — BECLOMETHASONE DIPROPIONATE 80 MCG/ACT IN AERS: 2.0000 | INHALATION_SPRAY | Freq: Two times a day (BID) | RESPIRATORY_TRACT | 3 refills | Status: DC

## 2016-10-07 MED ORDER — FLUTICASONE PROPIONATE 50 MCG/ACT NA SUSP: 1.0000 | Freq: Two times a day (BID) | NASAL | 3 refills | Status: AC

## 2016-10-07 NOTE — Progress Notes (Signed)
HPI:   Brenda Vargas is a 26 y.o. female, who is here today to follow on some of ehr chronic medical problems. Today she is here with her grandmother. She was last seen 07/07/16. Hx of depression and anxiety, she is taking Fluoxetine 20 mg daily, which has helped with irritability. She did not arranged follow up with her psychiatrists as recommended. Still fatigue, helping more with house chores because grandmother wakes her up and makes her g=doing so, otherwise she would be sleeping most of the day.  She denies suicidal thoughts.  -She is not exercising regularly and not following dietary recommendations given last OV.  -Last OV, she was also c/o chest pain, which I thought it was musculoskeletal. She states that is resolved but started again a couple daily ago. Grandmother thinks she may have pulled a muscle when she was helping with lifting some things around the house. She denies skin rash or limitation of daily activities due to pain. Pain is intermittent, some times "bad", no radiated, localized on right upper chest, above breast. Exacerbated by palpation and right shoulder movement, alleviated by rest. She takes Ibuprofen and helps.  + Heartburn, she is not taking Omeprazole recommended last OV.  Denies abdominal pain, nausea, vomiting, changes in bowel habits, blood in stool or melena.   -"Sinus issues" Nasal congestion and rhinorrhea for a the past few days. Frontal pressure headache. No fever,chills, or body aches. No sick contact or recent travel.  -Still coughing intermittently and having occasional wheezing almost daily and usually at night. She uses Albuterol as needed, not sure about frequency but almost daily.   -Also c/o right foot pain for 3-4 days, no Hx of trauma.  Moderate pain exacerbated by walking on tiptoes. No edema or erythema. Pain is intermittent, cannot described type of pain, no prior Hx.  Occasional low back pain when bending, no  limitation of ROM, no radiated.    Review of Systems  Constitutional: Positive for fatigue. Negative for activity change, appetite change and fever.  HENT: Positive for congestion, postnasal drip, rhinorrhea, sinus pressure and sore throat. Negative for mouth sores, nosebleeds, trouble swallowing and voice change.   Eyes: Negative for discharge and redness.  Respiratory: Positive for cough and wheezing. Negative for shortness of breath.   Cardiovascular: Negative for palpitations and leg swelling.  Gastrointestinal: Negative for abdominal pain, nausea and vomiting.  Musculoskeletal: Negative for gait problem and neck pain.  Skin: Negative for rash.  Allergic/Immunologic: Positive for environmental allergies.  Neurological: Negative for syncope, weakness and headaches.  Psychiatric/Behavioral: Positive for sleep disturbance. Negative for confusion, hallucinations and suicidal ideas. The patient is nervous/anxious.       Current Outpatient Prescriptions on File Prior to Visit  Medication Sig Dispense Refill  . omeprazole (PRILOSEC) 40 MG capsule Take 40 mg by mouth daily.    Marland Kitchen topiramate (TOPAMAX) 100 MG tablet Take 100 mg by mouth 2 (two) times daily.    . [DISCONTINUED] albuterol (PROVENTIL HFA;VENTOLIN HFA) 108 (90 BASE) MCG/ACT inhaler Inhale 1-2 puffs into the lungs every 6 (six) hours as needed for wheezing or shortness of breath. 1 Inhaler 3  . [DISCONTINUED] fexofenadine (ALLEGRA) 180 MG tablet Take 1 tablet (180 mg total) by mouth daily. (Patient not taking: Reported on 08/18/2014) 14 tablet 0  . [DISCONTINUED] ipratropium (ATROVENT) 0.06 % nasal spray Place 2 sprays into both nostrils 4 (four) times daily. (Patient not taking: Reported on 07/16/2015) 15 mL 12   No current  facility-administered medications on file prior to visit.      Past Medical History:  Diagnosis Date  . Allergy   . Anxiety   . Asthma   . Chronic headaches    . Migraine, unspecified, without mention  of intractable migraine without mention of status migrainosus   . Depression   . GERD (gastroesophageal reflux disease)   . Seizure (HCC)    . Generalized convulsive epilepsy without mention of intractable epilepsy    Allergies  Allergen Reactions  . Codeine Rash    Social History   Social History  . Marital status: Single    Spouse name: N/A  . Number of children: N/A  . Years of education: N/A   Social History Main Topics  . Smoking status: Former Games developermoker  . Smokeless tobacco: Never Used  . Alcohol use No  . Drug use: No  . Sexual activity: Not Currently    Birth control/ protection: None   Other Topics Concern  . None   Social History Narrative  . None    Vitals:   10/07/16 0915  BP: 124/86  Pulse: 88  Temp: 98.2 F (36.8 C)    O2 sat at RA 98%.  Body mass index is 40.25 kg/m.   Wt Readings from Last 3 Encounters:  10/07/16 234 lb 8 oz (106.4 kg)  07/07/16 236 lb (107 kg)  07/16/15 233 lb 9.6 oz (106 kg)      Physical Exam  Nursing note and vitals reviewed. Constitutional: She is oriented to person, place, and time. She appears well-developed. No distress.  HENT:  Head: Atraumatic.  Nose: Right sinus exhibits no maxillary sinus tenderness and no frontal sinus tenderness. Left sinus exhibits no maxillary sinus tenderness and no frontal sinus tenderness.  Mouth/Throat: Oropharynx is clear and moist and mucous membranes are normal.  Post nasal drainage.  Eyes: Conjunctivae and EOM are normal. Pupils are equal, round, and reactive to light.  Cardiovascular: Normal rate and regular rhythm.   No murmur heard. Pulses:      Dorsalis pedis pulses are 2+ on the right side, and 2+ on the left side.  Respiratory: Effort normal and breath sounds normal. No respiratory distress. She exhibits tenderness.  GI: Soft. She exhibits no mass. There is no hepatomegaly. There is no tenderness.  Musculoskeletal: She exhibits no edema.  Left first MTP joint pain with  palpation. No edema or deformity. Some trigger points: Left UE pain, right chest wall.  Lymphadenopathy:       Head (right side): No submandibular adenopathy present.       Head (left side): No submandibular adenopathy present.    She has no cervical adenopathy.       Right: No supraclavicular adenopathy present.       Left: No supraclavicular adenopathy present.  Neurological: She is alert and oriented to person, place, and time. She has normal strength. Coordination normal.  Skin: Skin is warm. No erythema.  Psychiatric: Her mood appears anxious. She expresses no suicidal ideation.  Well groomed, good eye contact. Behavior does not correlate with her age, mild cognitive impairment.       ASSESSMENT AND PLAN:     Morrie Sheldonshley was seen today for follow-up.  Diagnoses and all orders for this visit:    Chronic fatigue  We reviewed some causes, she has a few chronic conditions that can cause/aggravate problem. Last OV some lab work was done and otherwise negative.  Gastroesophageal reflux disease, esophagitis presence not specified  Still  symptomatic. Explained that could also aggravate asthma and allergic rhinitis.  GERD precautions discussed. Wt loss may also help. Recommend trying Omeprazole daily x 3-4 weeks.  Mild intermittent extrinsic asthma with status asthmaticus  QVAR added today. Some side effects discussed, swishing mouth after use recommended. Continue Albuterol inh as needed. Wt loss encouraged.  F/U in 3 months.    -     beclomethasone (QVAR) 80 MCG/ACT inhaler; Inhale 2 puffs into the lungs 2 (two) times daily.  Anxiety and depression  Grandmother reporting improvement in irritability. Not suicidal . No changes in current management. Strongly recommended to arrange appt with her psychiatrists. Family needs to schedule appt, I explained grandmother I do not think she is going to do it herself. Instructed about warning signs.  -     FLUoxetine  (PROZAC) 20 MG capsule; Take 1 capsule (20 mg total) by mouth daily. Needs appt with psychiatrists for more refills  Allergic rhinitis, unspecified chronicity, unspecified seasonality, unspecified trigger  Intranasal steroid recommended. Nasal irrigations with saline. OTC Allegra or Zyrtec daily.  -     fluticasone (FLONASE) 50 MCG/ACT nasal spray; Place 1 spray into both nostrils 2 (two) times daily.  Anterior chest wall pain  I still think it is musculoskeletal. She has some trigger points but not enough to meet criteria for fibromyalgia, still it needs to be considered. Mobic instead Ibuprofen due to GERD. Some side effects discussed.  -     meloxicam (MOBIC) 7.5 MG tablet; Take 1 tablet (7.5 mg total) by mouth daily as needed for pain.  Foot pain, left  ? Tenosynovitis.   I do not think imaging is needed today since there is no Hx of trauma. Comfortable shoe wear. Mobic daily as needed. If not better in a couple week or if it gets worse she was instructed to set up apt with podiatrists.      -Ms. Kirstine Jacquin was advised to return sooner than planned today if new concerns arise.       Betty G. [place], MD  [facility]. [place] office.

## 2016-10-07 NOTE — Patient Instructions (Signed)
A few things to remember from today's visit:   Gastroesophageal reflux disease, esophagitis presence not specified  Mild intermittent extrinsic asthma with status asthmaticus - Plan: beclomethasone (QVAR) 80 MCG/ACT inhaler  Anxiety and depression  Chronic fatigue  Allergic rhinitis, unspecified chronicity, unspecified seasonality, unspecified trigger - Plan: fluticasone (FLONASE) 50 MCG/ACT nasal spray  Anterior chest wall pain - Plan: meloxicam (MOBIC) 7.5 MG tablet   Please arrange appointment with psychiatrists.  What are some tips for weight loss? People become overweight for many reasons. Weight issues can run in families. They can be caused by unhealthy behaviors and a person's environment. Certain health problems and medicines can also lead to weight gain. There are some simple things you can do to reach and maintain a healthy weight:  Eat small more frequent healthy meals instead 3 bid meals. Also Weight Watchers is a good option. Avoid sweet drinks. These include regular soft drinks, fruit juices, fruit drinks, energy drinks, sweetened iced tea, and flavored milk. Avoid fast foods. Fast foods such as french fries, hamburgers, chicken nuggets, and pizza are high in calories and can cause weight gain. Eat a healthy breakfast. People who skip breakfast tend to weigh more. Don't watch more than two hours of television per day. Chew sugar-free gum between meals to cut down on snacking. Avoid grocery shopping when you're hungry. Pack a healthy lunch instead of eating out to control what and how much you eat. Eat a lot of fruits and vegetables. Aim for about 2 cups of fruit and 2 to 3 cups of vegetables per day. Aim for 150 minutes per week of moderate-intensity exercise (such as brisk walking), or 75 minutes per week of vigorous exercise (such as jogging or running). OR 15-30 min of daily brisk walking. Be more active. Small changes in physical activity can easily be added to your  daily routine. For example, take the stairs instead of the elevator. Take a walk with your family. A daily walk is a great way to get exercise and to catch up on the day's events.    Avoid foods that make your symptoms worse, for example coffee, chocolate,pepermeint,alcohol, and greasy food. Raising the head of your bed about 6 inches may help with nocturnal symptoms.  Avoid tobacco use. Weight loss (if you are overweight). Avoid lying down for 3 hours after eating.  Instead 3 large meals daily try small and more frequent meals during the day.    You should be evaluated immediately if bloody vomiting, bloody stools, black stools (like tar), difficulty swallowing, food gets stuck on the way down or choking when eating. Abnormal weight loss or severe abdominal pain.  If symptoms are not resolved sometimes endoscopy is necessary.   QVAR added to use daily for asthma. Nasal saline and over the counter Allegra 180 mg OR Zyrtec 10 mg daily. Take Omeprazole daily.  Please be sure medication list is accurate. If a new problem present, please set up appointment sooner than planned today.

## 2016-10-07 NOTE — Progress Notes (Signed)
Pre visit review using our clinic review tool, if applicable. No additional management support is needed unless otherwise documented below in the visit note. 

## 2016-10-08 MED ORDER — FLUOXETINE HCL 20 MG PO CAPS: 20.0000 mg | ORAL_CAPSULE | Freq: Every day | ORAL | 1 refills | Status: DC

## 2016-10-17 ENCOUNTER — Telehealth: Payer: Self-pay

## 2016-10-17 ENCOUNTER — Other Ambulatory Visit: Payer: Self-pay | Admitting: Family Medicine

## 2016-10-17 MED ORDER — FLUTICASONE PROPIONATE HFA 110 MCG/ACT IN AERO: 2.0000 | INHALATION_SPRAY | Freq: Two times a day (BID) | RESPIRATORY_TRACT | 4 refills | Status: AC

## 2016-10-17 NOTE — Telephone Encounter (Signed)
Flovent HFA Rx sent. Thanks, BJ

## 2016-10-17 NOTE — Telephone Encounter (Signed)
Received PA request for Qvar 80 mcg. Preferred products are:  Arnuity Ellipta  Flovent Diskus  Flovent HFA  Do you want to change to one of these?

## 2016-12-31 ENCOUNTER — Other Ambulatory Visit: Payer: Self-pay | Admitting: Family Medicine

## 2016-12-31 DIAGNOSIS — F329 Major depressive disorder, single episode, unspecified: Secondary | ICD-10-CM

## 2016-12-31 DIAGNOSIS — F419 Anxiety disorder, unspecified: Principal | ICD-10-CM

## 2017-01-04 NOTE — Progress Notes (Deleted)
      HPI:   [name] is a 26 y.o. female, who is here today to follow on some chronic medical problems.  She was last seen 10/07/16.  Recommend to follow with psychiatrists.  Asthma: QVAR added last OV. *** Chest pain: Mobic recommended.  ***  GERD:  Prilosec 40 mg.   Denies abdominal pain, nausea, vomiting, changes in bowel habits, blood in stool or melena.  Diet changes sone her last OV:*** Physical activity: ***     Review of Systems    Current Outpatient Prescriptions on File Prior to Visit  Medication Sig Dispense Refill  . FLUoxetine (PROZAC) 20 MG capsule TAKE 1 CAPSULE BY MOUTH ONCE DAILY....PATIENT NEEDS APPOINTMENT WITH PSYCHIATRISTS FOR MORE REFILLS 30 capsule 0  . fluticasone (FLONASE) 50 MCG/ACT nasal spray Place 1 spray into both nostrils 2 (two) times daily. 16 g 3  . fluticasone (FLOVENT HFA) 110 MCG/ACT inhaler Inhale 2 puffs into the lungs 2 (two) times daily. 1 Inhaler 4  . meloxicam (MOBIC) 7.5 MG tablet Take 1 tablet (7.5 mg total) by mouth daily as needed for pain. 30 tablet 0  . omeprazole (PRILOSEC) 40 MG capsule Take 40 mg by mouth daily.    Marland Kitchen topiramate (TOPAMAX) 100 MG tablet Take 100 mg by mouth 2 (two) times daily.    . [DISCONTINUED] albuterol (PROVENTIL HFA;VENTOLIN HFA) 108 (90 BASE) MCG/ACT inhaler Inhale 1-2 puffs into the lungs every 6 (six) hours as needed for wheezing or shortness of breath. 1 Inhaler 3  . [DISCONTINUED] fexofenadine (ALLEGRA) 180 MG tablet Take 1 tablet (180 mg total) by mouth daily. (Patient not taking: Reported on 08/18/2014) 14 tablet 0  . [DISCONTINUED] ipratropium (ATROVENT) 0.06 % nasal spray Place 2 sprays into both nostrils 4 (four) times daily. (Patient not taking: Reported on 07/16/2015) 15 mL 12   No current facility-administered medications on file prior to visit.      Past Medical History:  Diagnosis Date  . Allergy   . Anxiety   . Asthma   . Chronic headaches    . Migraine,  unspecified, without mention of intractable migraine without mention of status migrainosus   . Depression   . GERD (gastroesophageal reflux disease)   . Seizure (HCC)    . Generalized convulsive epilepsy without mention of intractable epilepsy    Allergies  Allergen Reactions  . Codeine Rash    Social History   Social History  . Marital status: Single    Spouse name: N/A  . Number of children: N/A  . Years of education: N/A   Social History Main Topics  . Smoking status: Former Games developer  . Smokeless tobacco: Never Used  . Alcohol use No  . Drug use: No  . Sexual activity: Not Currently    Birth control/ protection: None   Other Topics Concern  . Not on file   Social History Narrative  . No narrative on file    There were no vitals filed for this visit. There is no height or weight on file to calculate BMI.   Physical Exam    ASSESSMENT AND PLAN:     There are no diagnoses linked to this encounter.         -Ms. Brenda Vargas was advised to return sooner than planned today if new concerns arise.       Sanaai Doane G. [place], MD  [facility]. [place] office.

## 2017-01-05 ENCOUNTER — Ambulatory Visit: Payer: Medicare HMO | Admitting: Family Medicine

## 2017-04-13 DIAGNOSIS — R569 Unspecified convulsions: Secondary | ICD-10-CM | POA: Diagnosis not present

## 2017-08-11 IMAGING — DX DG CHEST 2V
2 series · 2 of 2 positions shown · non-contrast
Comparison: Chest x-ray of October 31, 2012.

CLINICAL DATA: One month of cough, 6 months of intermittent
right-sided chest pain. History of asthma. Former smoker.

EXAM:
CHEST  2 VIEW

[chest pa]
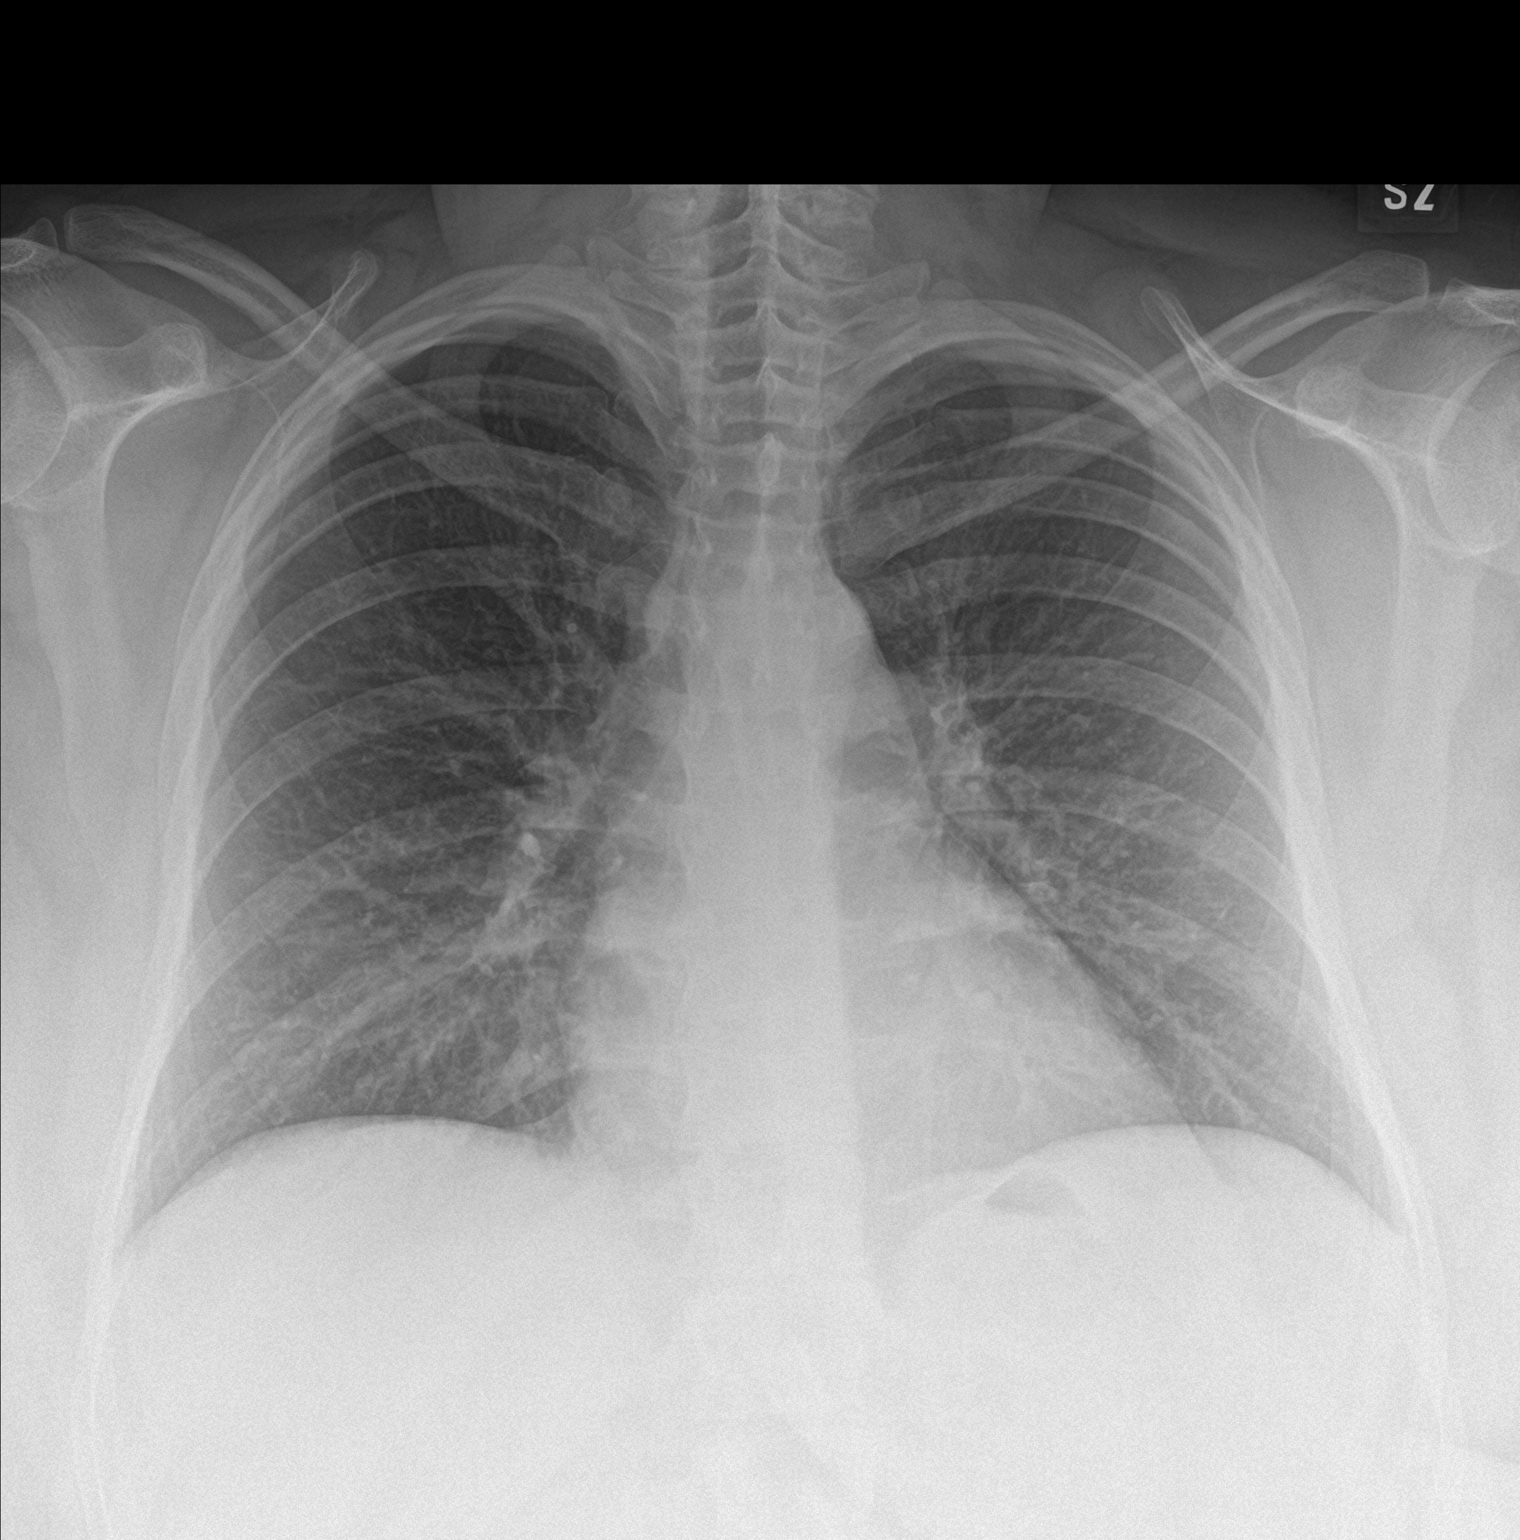

[chest lat]
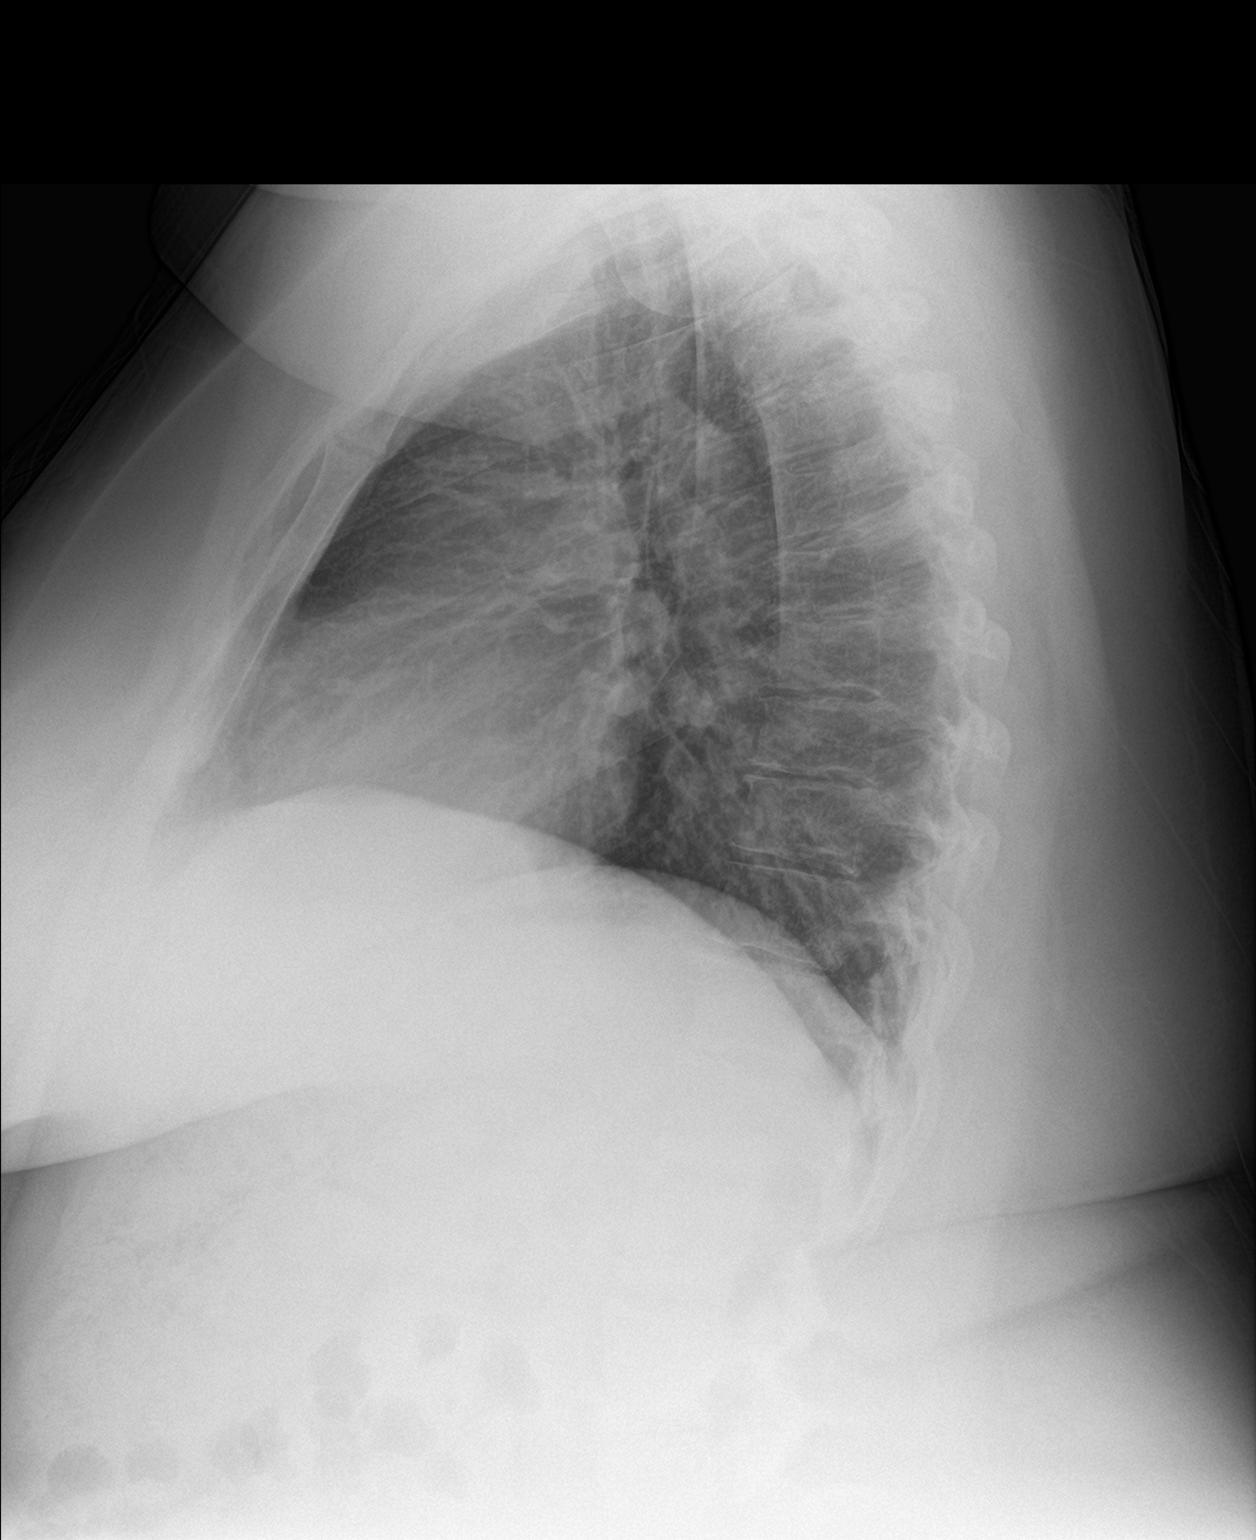

[2 of 2 positions shown; findings below may reference images not displayed]

FINDINGS: The lungs are adequately inflated. There is no focal infiltrate. The
interstitial markings are more prominent bilaterally than on the
previous study. The heart and pulmonary vascularity are normal. The
mediastinum is normal in width. There is no pleural effusion. The
bony thorax exhibits no acute abnormality.
IMPRESSION: Mild interstitial prominence consistent with known reactive airway
disease. There is no pneumonia, CHF, nor other acute cardiopulmonary
abnormality.

## 2019-09-06 ENCOUNTER — Other Ambulatory Visit: Payer: Self-pay

## 2019-09-06 DIAGNOSIS — Z20822 Contact with and (suspected) exposure to covid-19: Secondary | ICD-10-CM

## 2019-09-08 LAB — NOVEL CORONAVIRUS, NAA: SARS-CoV-2, NAA: NOT DETECTED

## 2023-04-27 DIAGNOSIS — G40909 Epilepsy, unspecified, not intractable, without status epilepticus: Secondary | ICD-10-CM | POA: Diagnosis not present

## 2023-05-31 DIAGNOSIS — F4310 Post-traumatic stress disorder, unspecified: Secondary | ICD-10-CM | POA: Diagnosis not present

## 2023-05-31 DIAGNOSIS — F900 Attention-deficit hyperactivity disorder, predominantly inattentive type: Secondary | ICD-10-CM | POA: Diagnosis not present

## 2023-05-31 DIAGNOSIS — F3341 Major depressive disorder, recurrent, in partial remission: Secondary | ICD-10-CM | POA: Diagnosis not present

## 2023-05-31 DIAGNOSIS — F411 Generalized anxiety disorder: Secondary | ICD-10-CM | POA: Diagnosis not present

## 2023-06-08 DIAGNOSIS — F70 Mild intellectual disabilities: Secondary | ICD-10-CM | POA: Diagnosis not present

## 2023-06-08 DIAGNOSIS — F4310 Post-traumatic stress disorder, unspecified: Secondary | ICD-10-CM | POA: Diagnosis not present

## 2023-06-28 DIAGNOSIS — F3341 Major depressive disorder, recurrent, in partial remission: Secondary | ICD-10-CM | POA: Diagnosis not present

## 2023-06-28 DIAGNOSIS — F4310 Post-traumatic stress disorder, unspecified: Secondary | ICD-10-CM | POA: Diagnosis not present

## 2023-06-28 DIAGNOSIS — F411 Generalized anxiety disorder: Secondary | ICD-10-CM | POA: Diagnosis not present

## 2023-06-28 DIAGNOSIS — F900 Attention-deficit hyperactivity disorder, predominantly inattentive type: Secondary | ICD-10-CM | POA: Diagnosis not present

## 2023-06-29 DIAGNOSIS — Z0000 Encounter for general adult medical examination without abnormal findings: Secondary | ICD-10-CM | POA: Diagnosis not present

## 2023-07-18 DIAGNOSIS — R1012 Left upper quadrant pain: Secondary | ICD-10-CM | POA: Diagnosis not present

## 2023-07-18 DIAGNOSIS — F32A Depression, unspecified: Secondary | ICD-10-CM | POA: Diagnosis not present

## 2023-07-18 DIAGNOSIS — K5900 Constipation, unspecified: Secondary | ICD-10-CM | POA: Diagnosis not present

## 2023-07-18 DIAGNOSIS — Z87892 Personal history of anaphylaxis: Secondary | ICD-10-CM | POA: Diagnosis not present

## 2023-07-18 DIAGNOSIS — Z881 Allergy status to other antibiotic agents status: Secondary | ICD-10-CM | POA: Diagnosis not present

## 2023-07-18 DIAGNOSIS — Z885 Allergy status to narcotic agent status: Secondary | ICD-10-CM | POA: Diagnosis not present

## 2023-07-18 DIAGNOSIS — Z888 Allergy status to other drugs, medicaments and biological substances status: Secondary | ICD-10-CM | POA: Diagnosis not present

## 2023-07-18 DIAGNOSIS — R0602 Shortness of breath: Secondary | ICD-10-CM | POA: Diagnosis not present

## 2023-07-18 DIAGNOSIS — R569 Unspecified convulsions: Secondary | ICD-10-CM | POA: Diagnosis not present

## 2023-07-18 DIAGNOSIS — Z91018 Allergy to other foods: Secondary | ICD-10-CM | POA: Diagnosis not present

## 2023-07-20 DIAGNOSIS — L659 Nonscarring hair loss, unspecified: Secondary | ICD-10-CM | POA: Diagnosis not present

## 2023-07-25 DIAGNOSIS — F4310 Post-traumatic stress disorder, unspecified: Secondary | ICD-10-CM | POA: Diagnosis not present

## 2023-07-25 DIAGNOSIS — F70 Mild intellectual disabilities: Secondary | ICD-10-CM | POA: Diagnosis not present

## 2023-07-30 DIAGNOSIS — E119 Type 2 diabetes mellitus without complications: Secondary | ICD-10-CM | POA: Diagnosis not present

## 2023-07-30 DIAGNOSIS — N132 Hydronephrosis with renal and ureteral calculous obstruction: Secondary | ICD-10-CM | POA: Diagnosis not present

## 2023-07-30 DIAGNOSIS — Z79899 Other long term (current) drug therapy: Secondary | ICD-10-CM | POA: Diagnosis not present

## 2023-07-30 DIAGNOSIS — Z9889 Other specified postprocedural states: Secondary | ICD-10-CM | POA: Diagnosis not present

## 2023-07-30 DIAGNOSIS — Z8659 Personal history of other mental and behavioral disorders: Secondary | ICD-10-CM | POA: Diagnosis not present

## 2023-07-30 DIAGNOSIS — Z885 Allergy status to narcotic agent status: Secondary | ICD-10-CM | POA: Diagnosis not present

## 2023-07-30 DIAGNOSIS — Z8669 Personal history of other diseases of the nervous system and sense organs: Secondary | ICD-10-CM | POA: Diagnosis not present

## 2023-07-30 DIAGNOSIS — N23 Unspecified renal colic: Secondary | ICD-10-CM | POA: Diagnosis not present

## 2023-07-30 DIAGNOSIS — Z888 Allergy status to other drugs, medicaments and biological substances status: Secondary | ICD-10-CM | POA: Diagnosis not present

## 2023-07-30 DIAGNOSIS — R1084 Generalized abdominal pain: Secondary | ICD-10-CM | POA: Diagnosis not present

## 2023-07-30 DIAGNOSIS — K5730 Diverticulosis of large intestine without perforation or abscess without bleeding: Secondary | ICD-10-CM | POA: Diagnosis not present

## 2023-07-30 DIAGNOSIS — K449 Diaphragmatic hernia without obstruction or gangrene: Secondary | ICD-10-CM | POA: Diagnosis not present

## 2023-08-29 DIAGNOSIS — F70 Mild intellectual disabilities: Secondary | ICD-10-CM | POA: Diagnosis not present

## 2023-08-29 DIAGNOSIS — F4310 Post-traumatic stress disorder, unspecified: Secondary | ICD-10-CM | POA: Diagnosis not present

## 2023-08-30 DIAGNOSIS — L638 Other alopecia areata: Secondary | ICD-10-CM | POA: Diagnosis not present
